# Patient Record
Sex: Female | Born: 1995 | Race: Black or African American | Hispanic: No | Marital: Single | Smoking: Never smoker
Health system: Southern US, Community
[De-identification: ages and names within clinical notes are randomized; demographics above are authoritative.]

## PROBLEM LIST (undated history)

## (undated) DIAGNOSIS — S060XAA Concussion with loss of consciousness status unknown, initial encounter: Secondary | ICD-10-CM

## (undated) DIAGNOSIS — M419 Scoliosis, unspecified: Secondary | ICD-10-CM

## (undated) DIAGNOSIS — G43909 Migraine, unspecified, not intractable, without status migrainosus: Secondary | ICD-10-CM

## (undated) DIAGNOSIS — D649 Anemia, unspecified: Secondary | ICD-10-CM

## (undated) DIAGNOSIS — F419 Anxiety disorder, unspecified: Secondary | ICD-10-CM

## (undated) HISTORY — PX: CYST REMOVAL LEG: SHX6280

---

## 2014-02-16 ENCOUNTER — Emergency Department (HOSPITAL_COMMUNITY)

## 2014-02-16 ENCOUNTER — Emergency Department (HOSPITAL_COMMUNITY)
Admission: EM | Admit: 2014-02-16 | Discharge: 2014-02-16 | Disposition: A | Discharge status: Home / Self Care | Attending: Emergency Medicine | Admitting: Emergency Medicine

## 2014-02-16 ENCOUNTER — Encounter (HOSPITAL_COMMUNITY): Payer: Self-pay | Admitting: Emergency Medicine

## 2014-02-16 DIAGNOSIS — S6000XA Contusion of unspecified finger without damage to nail, initial encounter: Secondary | ICD-10-CM | POA: Insufficient documentation

## 2014-02-16 DIAGNOSIS — W2209XA Striking against other stationary object, initial encounter: Secondary | ICD-10-CM | POA: Insufficient documentation

## 2014-02-16 DIAGNOSIS — Y9389 Activity, other specified: Secondary | ICD-10-CM | POA: Insufficient documentation

## 2014-02-16 DIAGNOSIS — Y929 Unspecified place or not applicable: Secondary | ICD-10-CM | POA: Insufficient documentation

## 2014-02-16 MED ORDER — HYDROCODONE-ACETAMINOPHEN 5-325 MG PO TABS
1.0000 | ORAL_TABLET | Freq: Once | ORAL | Status: AC
Start: 2014-02-16 — End: 2014-02-16
  Administered 2014-02-16: 1 via ORAL
  Filled 2014-02-16: qty 1

## 2014-02-16 NOTE — Discharge Instructions (Signed)
Wear the splint to protect the finger tip.  You may lose your nail over the next several weeks if blood accumulates underneath the nail.  Remove your artificial nail. Keep your fingernail trimmed.

## 2014-02-16 NOTE — ED Notes (Signed)
Patient presents to ED with complaints of lefthand pain from slamming her hand in a bedside "trunk." Pt able to move hand and fingers, no bruising, bleeding, swelling or laceration noted to hand but small blood to her left middle fingernail.

## 2014-02-16 NOTE — ED Notes (Signed)
Discharge instructions reviewed with pt. Pt verbalized understanding.   

## 2014-02-16 NOTE — ED Provider Notes (Addendum)
CSN: [id number]     Arrival date & time 02/16/14  0354 History   First MD Initiated Contact with Patient 02/16/14 0356     Chief Complaint  Patient presents with  . Hand Pain      HPI  Patient had a left hand only on the edge of a large that side trunk. The lid slammed down. She has pain across her fingers and some blood adjacent her left middle fingernail. She is wearing large pink fake nails  History reviewed. No pertinent past medical history. History reviewed. No pertinent past surgical history. No family history on file. History  Substance Use Topics  . Smoking status: Never Smoker   . Smokeless tobacco: Not on file  . Alcohol Use: No   OB History   Grav Para Term Preterm Abortions TAB SAB Ect Mult Living                 Review of Systems Nasally pain to the left hand. No additional areas of pain or concern or injury   Allergies  Review of patient's allergies indicates not on file.  Home Medications   Prior to Admission medications   Medication Sig Start Date End Date Taking? Authorizing Provider  levonorgestrel-ethinyl estradiol (SEASONALE,INTROVALE,JOLESSA) 0.15-0.03 MG tablet Take 1 tablet by mouth daily.   Yes Historical Provider, MD  naproxen (NAPROSYN) 250 MG tablet Take 250 mg by mouth daily as needed.   Yes Historical Provider, MD   BP 116/65  Temp(Src) 98.1 F (36.7 C) (Oral)  Resp 18  SpO2 100%  LMP 01/31/2014 Physical Exam  Musculoskeletal:       Hands:  am unable to determine if she has subungual hematoma her nails are pink and opaque  ED Course  Procedures (including critical care time) Labs Review Labs Reviewed - No data to display  Imaging Review Dg Hand Complete Left  02/16/2014   CLINICAL DATA:  Left hand pain at the distal middle finger after injury.  EXAM: LEFT HAND - COMPLETE 3+ VIEW  COMPARISON:  None.  FINDINGS: There is no evidence of fracture or dislocation. There is no evidence of arthropathy or other focal bone abnormality.  Soft tissues are unremarkable.  IMPRESSION: Negative.   Electronically Signed   By: Burman NievesWilliam  Stevens M.D.   On: 02/16/2014 04:44     EKG Interpretation None      MDM   Final diagnoses:  Finger contusion    X-ray show no fractures. She has some blood adjacent to the nail at the tip. She has opaque pink false nails. She does not want me to remove this. I did tell her I could not tell her the probability of losing her nail as I cannot tell if she has a subungual hematoma. Asked her to remove the nail herself when she is able to do so when her pain is less. To keep her native nail trimmed.    Rolland PorterMark Jacqueleen Pulver, MD 02/16/14 [id number]  Rolland PorterMark Domnick Chervenak, MD 02/16/14 [phone]

## 2014-12-26 ENCOUNTER — Emergency Department (HOSPITAL_COMMUNITY)
Admission: EM | Admit: 2014-12-26 | Discharge: 2014-12-26 | Disposition: A | Discharge status: Home / Self Care | Attending: Emergency Medicine | Admitting: Emergency Medicine

## 2014-12-26 ENCOUNTER — Encounter (HOSPITAL_COMMUNITY): Payer: Self-pay | Admitting: *Deleted

## 2014-12-26 DIAGNOSIS — Z760 Encounter for issue of repeat prescription: Secondary | ICD-10-CM | POA: Diagnosis not present

## 2014-12-26 DIAGNOSIS — Z331 Pregnant state, incidental: Secondary | ICD-10-CM | POA: Diagnosis not present

## 2014-12-26 DIAGNOSIS — K208 Other esophagitis: Secondary | ICD-10-CM | POA: Insufficient documentation

## 2014-12-26 DIAGNOSIS — T50905A Adverse effect of unspecified drugs, medicaments and biological substances, initial encounter: Secondary | ICD-10-CM

## 2014-12-26 DIAGNOSIS — R109 Unspecified abdominal pain: Secondary | ICD-10-CM | POA: Diagnosis present

## 2014-12-26 DIAGNOSIS — M419 Scoliosis, unspecified: Secondary | ICD-10-CM | POA: Insufficient documentation

## 2014-12-26 DIAGNOSIS — N390 Urinary tract infection, site not specified: Secondary | ICD-10-CM | POA: Diagnosis not present

## 2014-12-26 DIAGNOSIS — Z793 Long term (current) use of hormonal contraceptives: Secondary | ICD-10-CM | POA: Diagnosis not present

## 2014-12-26 DIAGNOSIS — Z8679 Personal history of other diseases of the circulatory system: Secondary | ICD-10-CM | POA: Insufficient documentation

## 2014-12-26 HISTORY — DX: Scoliosis, unspecified: M41.9

## 2014-12-26 HISTORY — DX: Migraine, unspecified, not intractable, without status migrainosus: G43.909

## 2014-12-26 LAB — BASIC METABOLIC PANEL
Anion gap: 3 — ABNORMAL LOW (ref 5–15)
BUN: 10 mg/dL (ref 6–23)
CALCIUM: 9.1 mg/dL (ref 8.4–10.5)
CO2: 27 mmol/L (ref 19–32)
Chloride: 105 mmol/L (ref 96–112)
Creatinine, Ser: 0.63 mg/dL (ref 0.50–1.10)
GFR calc Af Amer: >90 mL/min (ref >90)
GFR calc non Af Amer: >90 mL/min (ref >90)
Glucose, Bld: 108 mg/dL — ABNORMAL HIGH (ref 70–99)
Potassium: 3.6 mmol/L (ref 3.5–5.1)
SODIUM: 135 mmol/L (ref 135–145)

## 2014-12-26 LAB — CBC WITH DIFFERENTIAL/PLATELET
BASOS PCT: 0 % (ref 0–1)
Basophils Absolute: 0 10*3/uL (ref 0.0–0.1)
EOS ABS: 0 10*3/uL (ref 0.0–0.7)
EOS PCT: 0 % (ref 0–5)
HEMATOCRIT: 31.7 % — AB (ref 36.0–46.0)
Hemoglobin: 10.8 g/dL — ABNORMAL LOW (ref 12.0–15.0)
Lymphocytes Relative: 20 % (ref 12–46)
Lymphs Abs: 2.5 10*3/uL (ref 0.7–4.0)
MCH: 29.8 pg (ref 26.0–34.0)
MCHC: 34.1 g/dL (ref 30.0–36.0)
MCV: 87.6 fL (ref 78.0–100.0)
Monocytes Absolute: 0.7 10*3/uL (ref 0.1–1.0)
Monocytes Relative: 5 % (ref 3–12)
NEUTROS PCT: 75 % (ref 43–77)
Neutro Abs: 9.4 10*3/uL — ABNORMAL HIGH (ref 1.7–7.7)
PLATELETS: 299 10*3/uL (ref 150–400)
RBC: 3.62 MIL/uL — AB (ref 3.87–5.11)
RDW: 13.1 % (ref 11.5–15.5)
WBC: 12.6 10*3/uL — ABNORMAL HIGH (ref 4.0–10.5)

## 2014-12-26 LAB — URINALYSIS, ROUTINE W REFLEX MICROSCOPIC
Bilirubin Urine: NEGATIVE
Glucose, UA: NEGATIVE mg/dL
Hgb urine dipstick: NEGATIVE
Ketones, ur: NEGATIVE mg/dL
Nitrite: POSITIVE — AB
Protein, ur: NEGATIVE mg/dL
Specific Gravity, Urine: 1.015 (ref 1.005–1.030)
UROBILINOGEN UA: 0.2 mg/dL (ref 0.0–1.0)
pH: 6.5 (ref 5.0–8.0)

## 2014-12-26 LAB — HEPATIC FUNCTION PANEL
ALK PHOS: 48 U/L (ref 39–117)
ALT: 16 U/L (ref 0–35)
AST: 21 U/L (ref 0–37)
Albumin: 4 g/dL (ref 3.5–5.2)
BILIRUBIN TOTAL: 0.3 mg/dL (ref 0.3–1.2)
Bilirubin, Direct: <0.1 mg/dL (ref 0.0–0.5)
Total Protein: 7 g/dL (ref 6.0–8.3)

## 2014-12-26 LAB — LIPASE, BLOOD: Lipase: 28 U/L (ref 11–59)

## 2014-12-26 LAB — URINE MICROSCOPIC-ADD ON

## 2014-12-26 LAB — POC URINE PREG, ED: Preg Test, Ur: POSITIVE — AB

## 2014-12-26 MED ORDER — RIZATRIPTAN BENZOATE 10 MG PO TABS: 10.0000 mg | ORAL_TABLET | ORAL | Status: DC | PRN

## 2014-12-26 MED ORDER — CEPHALEXIN 500 MG PO CAPS: 500.0000 mg | ORAL_CAPSULE | Freq: Four times a day (QID) | ORAL | Status: DC

## 2014-12-26 MED ORDER — GI COCKTAIL ~~LOC~~
30.0000 mL | Freq: Once | ORAL | Status: AC
  Administered 2014-12-26: 30 mL via ORAL
  Filled 2014-12-26: qty 30

## 2014-12-26 MED ORDER — MAGIC MOUTHWASH W/LIDOCAINE: 5.0000 mL | Freq: Four times a day (QID) | ORAL | Status: DC

## 2014-12-26 NOTE — ED Notes (Signed)
Patient presents stating she has had epigastric pain since about noon

## 2014-12-26 NOTE — Discharge Instructions (Signed)
Esophagitis °Esophagitis is inflammation of the esophagus. It can involve swelling, soreness, and pain in the esophagus. This condition can make it difficult and painful to swallow. °CAUSES  °Most causes of esophagitis are not serious. Many different factors can cause esophagitis, including: °· Gastroesophageal reflux disease (GERD). This is when acid from your stomach flows up into the esophagus. °· Recurrent vomiting. °· An allergic-type reaction. °· Certain medicines, especially those that come in large pills. °· Ingestion of harmful chemicals, such as household cleaning products. °· Heavy alcohol use. °· An infection of the esophagus. °· Radiation treatment for cancer. °· Certain diseases such as sarcoidosis, Crohn's disease, and scleroderma. These diseases may cause recurrent esophagitis. °SYMPTOMS  °· Trouble swallowing. °· Painful swallowing. °· Chest pain. °· Difficulty breathing. °· Nausea. °· Vomiting. °· Abdominal pain. °DIAGNOSIS  °Your caregiver will take your history and do a physical exam. Depending upon what your caregiver finds, certain tests may also be done, including: °· Barium X-ray. You will drink a solution that coats the esophagus, and X-rays will be taken. °· Endoscopy. A lighted tube is put down the esophagus so your caregiver can examine the area. °· Allergy tests. These can sometimes be arranged through follow-up visits. °TREATMENT  °Treatment will depend on the cause of your esophagitis. In some cases, steroids or other medicines may be given to help relieve your symptoms or to treat the underlying cause of your condition. Medicines that may be recommended include: °· Viscous lidocaine, to soothe the esophagus. °· Antacids. °· Acid reducers. °· Proton pump inhibitors. °· Antiviral medicines for certain viral infections of the esophagus. °· Antifungal medicines for certain fungal infections of the esophagus. °· Antibiotic medicines, depending on the cause of the esophagitis. °HOME CARE  INSTRUCTIONS  °· Avoid foods and drinks that seem to make your symptoms worse. °· Eat small, frequent meals instead of large meals. °· Avoid eating for the 3 hours prior to your bedtime. °· If you have trouble taking pills, use a pill splitter to decrease the size and likelihood of the pill getting stuck or injuring the esophagus on the way down. Drinking water after taking a pill also helps. °· Stop smoking if you smoke. °· Maintain a healthy weight. °· Wear loose-fitting clothing. Do not wear anything tight around your waist that causes pressure on your stomach. °· Raise the head of your bed 6 to 8 inches with wood blocks to help you sleep. Extra pillows will not help. °· Only take over-the-counter or prescription medicines as directed by your caregiver. °SEEK IMMEDIATE MEDICAL CARE IF: °· You have severe chest pain that radiates into your arm, neck, or jaw. °· You feel sweaty, dizzy, or lightheaded. °· You have shortness of breath. °· You vomit blood. °· You have difficulty or pain with swallowing. °· You have bloody or black, tarry stools. °· You have a fever. °· You have a burning sensation in the chest more than 3 times a week for more than 2 weeks. °· You cannot swallow, drink, or eat. °· You drool because you cannot swallow your saliva. °MAKE SURE YOU: °· Understand these instructions. °· Will watch your condition. °· Will get help right away if you are not doing well or get worse. °Document Released: 11/22/2004 Document Revised: 01/07/2012 Document Reviewed: 06/15/2011 °ExitCare® Patient Information ©2015 [employer]. This information is not intended to replace advice given to you by your health care provider. Make sure you discuss any questions you have with your health care provider. ° °  Urinary Tract Infection °Urinary tract infections (UTIs) can develop anywhere along your urinary tract. Your urinary tract is your body's drainage system for removing wastes and extra water. Your urinary tract includes  two kidneys, two ureters, a bladder, and a urethra. Your kidneys are a pair of bean-shaped organs. Each kidney is about the size of your fist. They are located below your ribs, one on each side of your spine. °CAUSES °Infections are caused by microbes, which are microscopic organisms, including fungi, viruses, and bacteria. These organisms are so small that they can only be seen through a microscope. Bacteria are the microbes that most commonly cause UTIs. °SYMPTOMS  °Symptoms of UTIs may vary by age and gender of the patient and by the location of the infection. Symptoms in young women typically include a frequent and intense urge to urinate and a painful, burning feeling in the bladder or urethra during urination. Older women and men are more likely to be tired, shaky, and weak and have muscle aches and abdominal pain. A fever may mean the infection is in your kidneys. Other symptoms of a kidney infection include pain in your back or sides below the ribs, nausea, and vomiting. °DIAGNOSIS °To diagnose a UTI, your caregiver will ask you about your symptoms. Your caregiver also will ask to provide a urine sample. The urine sample will be tested for bacteria and white blood cells. White blood cells are made by your body to help fight infection. °TREATMENT  °Typically, UTIs can be treated with medication. Because most UTIs are caused by a bacterial infection, they usually can be treated with the use of antibiotics. The choice of antibiotic and length of treatment depend on your symptoms and the type of bacteria causing your infection. °HOME CARE INSTRUCTIONS °· If you were prescribed antibiotics, take them exactly as your caregiver instructs you. Finish the medication even if you feel better after you have only taken some of the medication. °· Drink enough water and fluids to keep your urine clear or pale yellow. °· Avoid caffeine, tea, and carbonated beverages. They tend to irritate your bladder. °· Empty your bladder  often. Avoid holding urine for long periods of time. °· Empty your bladder before and after sexual intercourse. °· After a bowel movement, women should cleanse from front to back. Use each tissue only once. °SEEK MEDICAL CARE IF:  °· You have back pain. °· You develop a fever. °· Your symptoms do not begin to resolve within 3 days. °SEEK IMMEDIATE MEDICAL CARE IF:  °· You have severe back pain or lower abdominal pain. °· You develop chills. °· You have nausea or vomiting. °· You have continued burning or discomfort with urination. °MAKE SURE YOU:  °· Understand these instructions. °· Will watch your condition. °· Will get help right away if you are not doing well or get worse. °Document Released: 07/25/2005 Document Revised: 04/15/2012 Document Reviewed: 11/23/2011 °ExitCare® Patient Information ©2015 [employer]. This information is not intended to replace advice given to you by your health care provider. Make sure you discuss any questions you have with your health care provider. ° °

## 2014-12-26 NOTE — ED Provider Notes (Signed)
CSN: [id number]     Arrival date & time 12/26/14  0014 History   First MD Initiated Contact with Patient 12/26/14 [id number]     Chief Complaint  Patient presents with  . Abdominal Pain     (Consider location/radiation/quality/duration/timing/severity/associated sxs/prior Treatment) HPI   This is a 19 year old female who presents emergency Department with chief complaint of pain with swallowing. Patient states that yesterday she had back pain. She took 2 Advil, but felt that she swallowed them incompletely. Later she developed a sensation of burning in her chest. She tried drinking more fluids to get the Advil down. Since that time she's had significant chest pain that occurs with swallowing. Her pain is also made worse after she eats. She's been drinking cold fluids to try to alleviate her symptoms. She denies nausea, vomiting, diarrhea, hematochezia, hemoptysis or melena. Patient states last menstrual period was 12/14/2014. She denies abdominal pain, urinary symptoms, vaginal symptoms. 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 She asks if I could refill her Maxalt prescription. She does get migraine headaches [place]. He denies a headache at this time   Past Medical History  Diagnosis Date  . Migraines   . Scoliosis    Past Surgical History  Procedure Laterality Date  . Cyst removal leg     No family history on file. History  Substance Use Topics  . Smoking status: Never Smoker   . Smokeless tobacco: Never Used  . Alcohol Use: No   OB History    No data available     Review of Systems  Ten systems reviewed and are negative for acute change, except as noted in the HPI.    Allergies  Review of patient's allergies indicates no known allergies.  Home Medications   Prior to Admission medications   Medication Sig Start Date End Date Taking? Authorizing Provider  ibuprofen (ADVIL,MOTRIN) 800 MG tablet  Take 800 mg by mouth every 8 (eight) hours as needed for moderate pain.   Yes Historical Provider, MD  levonorgestrel-ethinyl estradiol (SEASONALE,INTROVALE,JOLESSA) 0.15-0.03 MG tablet Take 1 tablet by mouth daily.   Yes Historical Provider, MD   BP 104/74 mmHg  Pulse 72  Temp(Src) 98 F (36.7 C) (Oral)  Resp 21  Ht 5\' 6"  (1.676 m)  Wt 155 lb (70.308 kg)  BMI 25.03 kg/m2  SpO2 98%  LMP 12/14/2014 Physical Exam  Constitutional: She is oriented to person, place, and time. She appears well-developed and well-nourished. No distress.  HENT:  Head: Normocephalic and atraumatic.  Eyes: Conjunctivae are normal. No scleral icterus.  Neck: Normal range of motion.  Cardiovascular: Normal rate, regular rhythm and normal heart sounds.  Exam reveals no gallop and no friction rub.   No murmur heard. Pulmonary/Chest: Effort normal and breath sounds normal. No respiratory distress.  Abdominal: Soft. Bowel sounds are normal. She exhibits no distension and no mass. There is no tenderness. There is no guarding.  Neurological: She is alert and oriented to person, place, and time.  Skin: Skin is warm and dry. She is not diaphoretic.  Psychiatric: Her behavior is normal.    ED Course  Procedures (including critical care time) Labs Review Labs Reviewed  CBC WITH DIFFERENTIAL/PLATELET - Abnormal; Notable for the following:    WBC 12.6 (*)    RBC 3.62 (*)    Hemoglobin 10.8 (*)    HCT 31.7 (*)    Neutro Abs 9.4 (*)  All other components within normal limits  BASIC METABOLIC PANEL - Abnormal; Notable for the following:    Glucose, Bld 108 (*)    Anion gap 3 (*)    All other components within normal limits  URINALYSIS, ROUTINE W REFLEX MICROSCOPIC - Abnormal; Notable for the following:    APPearance CLOUDY (*)    Nitrite POSITIVE (*)    Leukocytes, UA TRACE (*)    All other components within normal limits  URINE MICROSCOPIC-ADD ON - Abnormal; Notable for the following:    Bacteria, UA MANY  (*)    All other components within normal limits  POC URINE PREG, ED - Abnormal; Notable for the following:    Preg Test, Ur POSITIVE (*)    All other components within normal limits  HEPATIC FUNCTION PANEL  LIPASE, BLOOD    Imaging Review No results found.   EKG Interpretation   Date/Time:  Sunday December 26 2014 00:16:44 EST Ventricular Rate:  83 PR Interval:  150 QRS Duration: 64 QT Interval:  374 QTC Calculation: 439 R Axis:   78 Text Interpretation:  Normal sinus rhythm with sinus arrhythmia Normal ECG  No old tracing to compare Confirmed by KNAPP  MD-I, IVA ([id number]) on  12/26/2014 7:35:23 AM      MDM   Final diagnoses:  None    Filed Vitals:   12/26/14 0715 12/26/14 0730 12/26/14 0845 12/26/14 0900  BP: 104/74  103/67 99/65  Pulse: 72  79 76  Temp:  98 F (36.7 C)    TempSrc:  Oral    Resp: Height:      Weight:      SpO2: 98%  98% 99%   Assumed care of the patient after 6 hour wait. Patient had a BMP and CBC performed. I had added on. A Hepatic function panel, urine, urine pregnancy.    Patient hepatic panel appears within normal limits. She does have a positive urine pregnancy today. I discussed this with the patient. Patient states that she underwent an elective abortion on February 13. Patient states that her "period" was bleeding that occurred after. She denies any vaginal symptoms at this time. Feels that her pregnancy hormone elevation is probably still elevated after the abortion.  She had some relief with GI cocktail. Her labs. Otherwise normal. She does have a urinary tract infection today. Patient will be discharged with Keflex, Magic mouthwash with lidocaine, and her Maxalt prescription. She is tolerating fluids by mouth. She appears safe for discharge at this time. I have discussed return precautions. I personally reviewed the imaging tests through PACS system. I have reviewed and interpreted Lab values. I reviewed available  ER/hospitalization records through the EMR        Arthor Captain, PA-C 12/26/14 1020  Ward Givens, MD 12/27/14 [phone]

## 2015-02-19 ENCOUNTER — Encounter (HOSPITAL_COMMUNITY): Payer: Self-pay

## 2015-02-19 ENCOUNTER — Emergency Department (HOSPITAL_COMMUNITY)
Admission: EM | Admit: 2015-02-19 | Discharge: 2015-02-19 | Disposition: A | Discharge status: Home / Self Care | Attending: Emergency Medicine | Admitting: Emergency Medicine

## 2015-02-19 DIAGNOSIS — Z79899 Other long term (current) drug therapy: Secondary | ICD-10-CM | POA: Diagnosis not present

## 2015-02-19 DIAGNOSIS — J029 Acute pharyngitis, unspecified: Secondary | ICD-10-CM | POA: Insufficient documentation

## 2015-02-19 DIAGNOSIS — Z8739 Personal history of other diseases of the musculoskeletal system and connective tissue: Secondary | ICD-10-CM | POA: Diagnosis not present

## 2015-02-19 DIAGNOSIS — G43909 Migraine, unspecified, not intractable, without status migrainosus: Secondary | ICD-10-CM | POA: Insufficient documentation

## 2015-02-19 LAB — RAPID STREP SCREEN (MED CTR MEBANE ONLY): Streptococcus, Group A Screen (Direct): NEGATIVE

## 2015-02-19 MED ORDER — DEXAMETHASONE SODIUM PHOSPHATE 10 MG/ML IJ SOLN
10.0000 mg | Freq: Once | INTRAMUSCULAR | Status: AC
  Administered 2015-02-19: 10 mg via INTRAMUSCULAR
  Filled 2015-02-19: qty 1

## 2015-02-19 MED ORDER — IBUPROFEN 400 MG PO TABS
800.0000 mg | ORAL_TABLET | Freq: Once | ORAL | Status: AC
  Administered 2015-02-19: 800 mg via ORAL
  Filled 2015-02-19: qty 4

## 2015-02-19 MED ORDER — IBUPROFEN 600 MG PO TABS: 600.0000 mg | ORAL_TABLET | Freq: Four times a day (QID) | ORAL | Status: DC | PRN

## 2015-02-19 NOTE — ED Provider Notes (Signed)
CSN: [id number]     Arrival date & time 02/19/15  1122 History  This chart was scribed for non-physician practitioner, Jinny SandersJoseph Herbie Lehrmann, PA-C working with Gilda Creasehristopher J Pollina, MD, by Abel PrestoKara Demonbreun, ED Scribe. This patient was seen in room TR07C/TR07C and the patient's care was started at 11:38 AM.     Chief Complaint  Patient presents with  . Sore Throat     Patient is a 19 y.o. female presenting with pharyngitis. The history is provided by the patient. No language interpreter was used.  Sore Throat   HPI Comments: Sharon Powell is a 19 y.o. female who presents to the Emergency Department complaining of sore throat with onset 2 days ago. Pt notes associated odynophagia with onset today. Pt has not taken any medication for relief. Pt denies cough and fever, SOB.    Past Medical History  Diagnosis Date  . Migraines   . Scoliosis    Past Surgical History  Procedure Laterality Date  . Cyst removal leg     No family history on file. History  Substance Use Topics  . Smoking status: Never Smoker   . Smokeless tobacco: Never Used  . Alcohol Use: No   OB History    No data available     Review of Systems  Constitutional: Negative for fever.  HENT: Positive for sore throat.   Respiratory: Negative for cough.       Allergies  Review of patient's allergies indicates no known allergies.  Home Medications   Prior to Admission medications   Medication Sig Start Date End Date Taking? Authorizing Provider  Alum & Mag Hydroxide-Simeth (MAGIC MOUTHWASH W/LIDOCAINE) SOLN Take 5-10 mLs by mouth 4 (four) times daily. 12/26/14   Arthor CaptainAbigail Harris, PA-C  cephALEXin (KEFLEX) 500 MG capsule Take 1 capsule (500 mg total) by mouth 4 (four) times daily. 12/26/14   Arthor CaptainAbigail Harris, PA-C  ibuprofen (ADVIL,MOTRIN) 600 MG tablet Take 1 tablet (600 mg total) by mouth every 6 (six) hours as needed. 02/19/15   Ladona MowJoe Nahla Lukin, PA-C  levonorgestrel-ethinyl estradiol (SEASONALE,INTROVALE,JOLESSA) 0.15-0.03 MG  tablet Take 1 tablet by mouth daily.    Historical Provider, MD  rizatriptan (MAXALT) 10 MG tablet Take 1 tablet (10 mg total) by mouth as needed for migraine. May repeat in 2 hours if needed 12/26/14   Arthor CaptainAbigail Harris, PA-C   BP 107/66 mmHg  Pulse 85  Temp(Src) 97.8 F (36.6 C) (Oral)  Resp 20  Ht 5\' 6"  (1.676 m)  Wt 149 lb (67.586 kg)  BMI 24.06 kg/m2  SpO2 98%  LMP 02/13/2015 Physical Exam  Constitutional: She is oriented to person, place, and time. She appears well-developed and well-nourished.  HENT:  Head: Normocephalic.  Right Ear: Tympanic membrane normal.  Left Ear: Tympanic membrane normal.  Mouth/Throat: Posterior oropharyngeal erythema (mild) present. No oropharyngeal exudate.  Nasal turbinates non erythematous, non edematous No tonsillar swelling  Eyes: Conjunctivae are normal.  Neck: Normal range of motion. Neck supple.  Pulmonary/Chest: Effort normal.  Musculoskeletal: Normal range of motion.  Neurological: She is alert and oriented to person, place, and time.  Skin: Skin is warm and dry.  Psychiatric: She has a normal mood and affect. Her behavior is normal.  Nursing note and vitals reviewed.   ED Course  Procedures (including critical care time) DIAGNOSTIC STUDIES: Oxygen Saturation is 98% on room air, normal by my interpretation.    COORDINATION OF CARE: 11:42 AM Discussed treatment plan with patient at beside, the patient agrees with the plan and has  no further questions at this time.   Labs Review Labs Reviewed  RAPID STREP SCREEN  CULTURE, GROUP A STREP    Imaging Review No results found.   EKG Interpretation None     MDM   Final diagnoses:  Viral pharyngitis   Patient here with signs and symptoms consistent with a viral pharyngitis. Rapid strep negative. Patient afebrile, well-appearing, nontoxic, hemodynamically stable and in no acute distress. Patient tolerating by mouth well, no trismus or tonsillar swelling, no concern for PTA.  Encouraged use of ibuprofen and Tylenol for pain. Discharged with instruction to follow up with primary care provider. Return precautions discussed, patient verbalizes understanding and agreement of this plan. Encouraged patient to call or return to the ER with any worsening symptoms or should she have any questions or concerns.  I personally performed the services described in this documentation, which was scribed in my presence. The recorded information has been reviewed and is accurate.  BP 107/66 mmHg  Pulse 85  Temp(Src) 97.8 F (36.6 C) (Oral)  Resp 20  Ht  (1.676 m)  Wt 149 lb (67.586 kg)  BMI 24.06 kg/m2  SpO2 98%  LMP 02/13/2015  Signed,  Ladona Mow, PA-C 12:24 PM    Ladona Mow, PA-C 02/19/15 1224  [name], MD 02/19/15 1230

## 2015-02-19 NOTE — ED Notes (Signed)
Pt here for sore throat the past few days. Hurts worse this morning.

## 2015-02-19 NOTE — Discharge Instructions (Signed)
Pharyngitis °Pharyngitis is redness, pain, and swelling (inflammation) of your pharynx.  °CAUSES  °Pharyngitis is usually caused by infection. Most of the time, these infections are from viruses (viral) and are part of a cold. However, sometimes pharyngitis is caused by bacteria (bacterial). Pharyngitis can also be caused by allergies. Viral pharyngitis may be spread from person to person by coughing, sneezing, and personal items or utensils (cups, forks, spoons, toothbrushes). Bacterial pharyngitis may be spread from person to person by more intimate contact, such as kissing.  °SIGNS AND SYMPTOMS  °Symptoms of pharyngitis include:   °· Sore throat.   °· Tiredness (fatigue).   °· Low-grade fever.   °· Headache. °· Joint pain and muscle aches. °· Skin rashes. °· Swollen lymph nodes. °· Plaque-like film on throat or tonsils (often seen with bacterial pharyngitis). °DIAGNOSIS  °Your health care provider will ask you questions about your illness and your symptoms. Your medical history, along with a physical exam, is often all that is needed to diagnose pharyngitis. Sometimes, a rapid strep test is done. Other lab tests may also be done, depending on the suspected cause.  °TREATMENT  °Viral pharyngitis will usually get better in 3-4 days without the use of medicine. Bacterial pharyngitis is treated with medicines that kill germs (antibiotics).  °HOME CARE INSTRUCTIONS  °· Drink enough water and fluids to keep your urine clear or pale yellow.   °· Only take over-the-counter or prescription medicines as directed by your health care provider:   °¨ If you are prescribed antibiotics, make sure you finish them even if you start to feel better.   °¨ Do not take aspirin.   °· Get lots of rest.   °· Gargle with 8 oz of salt water (½ tsp of salt per 1 qt of water) as often as every 1-2 hours to soothe your throat.   °· Throat lozenges (if you are not at risk for choking) or sprays may be used to soothe your throat. °SEEK MEDICAL  CARE IF:  °· You have large, tender lumps in your neck. °· You have a rash. °· You cough up green, yellow-brown, or bloody spit. °SEEK IMMEDIATE MEDICAL CARE IF:  °· Your neck becomes stiff. °· You drool or are unable to swallow liquids. °· You vomit or are unable to keep medicines or liquids down. °· You have severe pain that does not go away with the use of recommended medicines. °· You have trouble breathing (not caused by a stuffy nose). °MAKE SURE YOU:  °· Understand these instructions. °· Will watch your condition. °· Will get help right away if you are not doing well or get worse. °Document Released: 10/15/2005 Document Revised: 08/05/2013 Document Reviewed: 06/22/2013 °ExitCare® Patient Information ©2015 [employer]. This information is not intended to replace advice given to you by your health care provider. Make sure you discuss any questions you have with your health care provider. ° ° °Emergency Department Resource Guide °1) Find a Doctor and Pay Out of Pocket °Although you won't have to find out who is covered by your insurance plan, it is a good idea to ask around and get recommendations. You will then need to call the office and see if the doctor you have chosen will accept you as a new patient and what types of options they offer for patients who are self-pay. Some doctors offer discounts or will set up payment plans for their patients who do not have insurance, but you will need to ask so you aren't surprised when you get to your appointment. ° °  2) Contact Your Local Health Department °Not all health departments have doctors that can see patients for sick visits, but many do, so it is worth a call to see if yours does. If you don't know where your local health department is, you can check in your phone book. The CDC also has a tool to help you locate your state's health department, and many state websites also have listings of all of their local health departments. ° °3) Find a Walk-in Clinic °If  your illness is not likely to be very severe or complicated, you may want to try a walk in clinic. These are popping up all over the country in pharmacies, drugstores, and shopping centers. They're usually staffed by nurse practitioners or physician assistants that have been trained to treat common illnesses and complaints. They're usually fairly quick and inexpensive. However, if you have serious medical issues or chronic medical problems, these are probably not your best option. ° °No Primary Care Doctor: °- Call [facility] at  832-8000 - they can help you locate a primary care doctor that  accepts your insurance, provides certain services, etc. °- Physician Referral Service- 1-800-533-3463 ° °Chronic Pain Problems: °Organization         Address  Phone   Notes  °[facility]  (336) 297-2271 Patients need to be referred by their primary care doctor.  ° °Medication Assistance: °Organization         Address  Phone   Notes  °[place] [facility] [address] °[place], [place] [address] (336) 641-8030 --Must be a resident of [place] °-- Must have NO insurance coverage whatsoever (no Medicaid/ Medicare, etc.) °-- The pt. MUST have a primary care doctor that directs their care regularly and follows them in the community °  °[facility]  (866) 331-1348   °[employer]  (888) 892-1162   ° °Agencies that provide inexpensive medical care: °Organization         Address  Phone   Notes  °[facility]  (336) 832-8035   °[facility]    (336) 832-7272   °[facility] [address] °[place], [place] [address] (336) 832-4777   °[facility] [address], °[place] (336) 271-4999   °[facility]    (336) 373-0678   °[facility]    (336) 272-1050   °[facility] ° [address], [place] Phone:  (336) 832-4444, Fax:  (336) 832-4440 Hours of  Operation:  9 am - 6 pm, M-F.  Also accepts Medicaid/Medicare and self-pay.  °[facility] ° [address], [place] Phone: (336) 832-3150, Fax: (336) [phone]. Hours of Operation:  8:30 am - 5:30 pm, M-F.  Also accepts Medicaid and self-pay.  °[facility] [place] [address], [place] Phone: (336) 878-6027   °[facility] [address], [place], [place] (336)723-1848, Ext. 123 Mondays & Thursdays: 7-9 AM.  First 15 patients are seen on a first come, first serve basis. °  ° °Medicaid-accepting [place] Providers: ° °Organization         Address  Phone   Notes  °[facility] [address], [place] (336) 641-2100 Also accepts self-pay patients.  °[facility] [address], [place] ° (336) 856-9996   °[facility] [address], [place] (336) 288-8857   °[facility] [address] [place] [address], [place] (336) 299-7000   °Veita Bland [address], [place]  ° (336) 373-1557 Only accepts [place] Access Medicaid patients after they have their name applied to their card.  ° °Self-Pay (no insurance) in [place]: ° °Organization         Address  Phone   Notes  °Sickle Cell Patients, [facility] [address], [place] (336) 832-1970   °[facility] [address], [place] (336) 832-4400   °[facility] [place] ° [address], [place] (336) 992-4800   °[facility]/Dr. Osei-Bonsu ° [address] [place] [address], [place] or [address], [place] (336) [phone] Phone number for both [place] and [place] locations is the same.  °[facility] [address], [place] (336) 299-0000   °[facility] [address] [place] [address], [place] or [address] (336) 852-7530 °(336) 878-2260   °[facility] [address], [place] (336) 350-1642, phone; (336) 294-5005, fax Sees patients 1st and 3rd Saturday of every month.  Must not qualify for public or private insurance (i.e. Medicaid, Medicare, [place] Health Choice, Veterans' Benefits) • Household income should be no more than 200% of the poverty level •The clinic cannot treat you if you are pregnant or think you are pregnant • Sexually transmitted diseases are not treated at the clinic.  ° ° °Dental Care: °Organization         Address  Phone  Notes  °[facility] [address], [place] (336) 641-6152 Accepts children up to age 21 who are enrolled in Medicaid or [place] Health Choice; pregnant women with a Medicaid card; and children who have applied for Medicaid or [place] Health Choice, but were declined, whose parents can pay a reduced fee at time of service.  °[facility] [place]  [address], [place] (336) 641-7733 Accepts children up to age 21 who are enrolled in Medicaid or [place] Health Choice; pregnant women with a Medicaid card; and children who have applied for Medicaid or [place] Health Choice, but were declined, whose parents can pay a reduced fee at time of service.  °[facility] ° [address], [place] (336) 641-4533 Patients are seen by appointment only. Walk-ins are not accepted. [facility] will see patients 18 years of age and older. °Monday - Tuesday (8am-5pm) °Most Wednesdays (8:30-5pm) °$30 per visit, cash only  °[facility] ° [address], [place] (336) 641-4533 Patients are seen by appointment only. Walk-ins are not accepted. [facility] will see patients 18 years of age and older. °One Wednesday Evening (Monthly: Volunteer Based).  $30 per visit, cash only  °[facility]  (919) [phone] for adults; Children under age 4, call Graduate Pediatric  Dentistry at ([phone]) [phone]. Children aged 4-14, please call ([phone]) [phone] to request a pediatric application. ° Dental services are provided in all areas of dental care including fillings, crowns and bridges, complete and partial dentures, implants, gum treatment, root canals, and extractions. Preventive care is also provided. Treatment is provided to both adults and children. °Patients are selected via a lottery and there is often a waiting list. °  °[facility] [address], °  [place] ° (336) 763-8833 [email or web address] °  °[facility] [address], [place], [place] (336)723-1848, Ext. 123 Second and Fourth Thursday of each month, opens at 6:30 AM; Clinic ends at 9 AM.  Patients are seen on a first-come first-served basis, and a limited number are seen during each clinic.  ° °[facility] ° [address], [place], [place] (336) 723-7904   Eligibility Requirements °You must have lived in [place], [place], or [place] counties for at least the last three months. °  You cannot be eligible for state or federal sponsored healthcare insurance, including Veterans Administration, Medicaid, or Medicare. °  You generally cannot be eligible for healthcare insurance through your employer.  °  How to apply: °Eligibility screenings are held every Tuesday and Wednesday afternoon from 1:00 pm until 4:00 pm. You do not need an appointment for the interview!  °[facility] [address], [place], [place] 336-631-2330   °[facility]  336-342-8273   °[facility]  336-703-3100   °[facility]  336-570-6415   ° °Behavioral Health Resources in the Community: °Intensive Outpatient Programs °Organization         Address  Phone  Notes  °[facility] [address], [place], [place] 336-878-6098   °[facility] [address], [place], [place] 336-832-9800   °[facility] [address], [place], [place] ° 336-882-2125   °[facility] [address],  °[place], [place] 1-800-853-5163 or 336-641-4981   °Substance Abuse Resources °Organization         Address  Phone  Notes  °[facility]  336-882-2125   °[facility]  336-784-9470   °[facility]  336-285-9073   °[facility]  336-845-3988   °Residential & Outpatient Substance Abuse Program  1-800-659-3381   °Psychological Services °Organization         Address  Phone  Notes  °[facility]  336- 832-9600   °[facility]  336- 378-7881   °[facility] [address], [place] 1-800-853-5163 or 336-641-4981   ° °Mobile Crisis Teams °Organization         Address  Phone  Notes  °[facility], Mobile Crisis Care Unit  1-877-626-1772   °Assertive °[facility] ° [address] [place], [place] 336-834-9664   °Sharon DeEsch [address] °[place] 336-554-5454   ° °Self-Help/Support Groups °Organization         Address  Phone             Notes  °[facility] - variety of support groups  336- 373-1402 Call for more information  °Narcotics Anonymous (NA), [facility] [address], °[place]  2 meetings at this location  ° °Residential Treatment Programs °Organization         Address  Phone  Notes  °[facility] [address],    °[place]  1-866-801-8205   °[facility] ° [address], [place], [place] 704-293-8524   °[facility] [address], [place] 336-845-3988 Admissions: 8am-3pm M-F  °[facility] [address]    °[place], [place] 336-841-1104   °[facility] [address], [place], [place] 336-379-7146   °[facility] [address]  °[place],  336-285-9073   °[facility] - Intensive Outpatient [address],  [place], [place] 336-852-3033   °[facility] [address]  °[place], [place] 1-877-615-2722 or 336-784-9470   °[facility] [address] [place], [place] 336-227-7417 Accepts Medicaid  °[facility] [address]  °[place] 1-800-659-3381 Substance Abuse/Addiction Treatment  ° °[place] Behavioral Health Resources °Organization         Address  Phone  Notes  °[facility]  (888) 581-9988   °Julie Brannon, PhD [address] [place], [place]   (336) 349-5553 or (336) 951-0000   °[facility]   [address] °[place], [place] (336) 349-4454   °[facility] [address], [place], [place] (336) 342-8316 Insurance/Medicaid/sponsorship through [facility]  °[facility] [address]                                    [place], [place] (336) 342-8316 Therapy/tele-psych/case  °[facility] [address]  ° [place], [place] (336) 349-2233    °Dr. Arfeen  (336) 349-4544   °[facility] [place]  [employer] [place] [facility] 1) [address],  °2) [address], [place] °3)  [address], [place] (336) 349-3220 °(336) 342-7768 ° °(336) 342-8140   °[place] [facility] (336) 342-1394 or (336) 342-3537 (After Hours)    ° ° ° ° °

## 2015-02-21 LAB — CULTURE, GROUP A STREP: Strep A Culture: NEGATIVE

## 2015-02-23 ENCOUNTER — Encounter (HOSPITAL_COMMUNITY): Payer: Self-pay | Admitting: *Deleted

## 2015-02-23 ENCOUNTER — Emergency Department (HOSPITAL_COMMUNITY)

## 2015-02-23 ENCOUNTER — Emergency Department (HOSPITAL_COMMUNITY)
Admission: EM | Admit: 2015-02-23 | Discharge: 2015-02-23 | Disposition: A | Discharge status: Home / Self Care | Attending: Emergency Medicine | Admitting: Emergency Medicine

## 2015-02-23 DIAGNOSIS — Z8739 Personal history of other diseases of the musculoskeletal system and connective tissue: Secondary | ICD-10-CM | POA: Diagnosis not present

## 2015-02-23 DIAGNOSIS — G43909 Migraine, unspecified, not intractable, without status migrainosus: Secondary | ICD-10-CM | POA: Diagnosis not present

## 2015-02-23 DIAGNOSIS — Z792 Long term (current) use of antibiotics: Secondary | ICD-10-CM | POA: Diagnosis not present

## 2015-02-23 DIAGNOSIS — J029 Acute pharyngitis, unspecified: Secondary | ICD-10-CM

## 2015-02-23 DIAGNOSIS — B9789 Other viral agents as the cause of diseases classified elsewhere: Secondary | ICD-10-CM

## 2015-02-23 DIAGNOSIS — Z79899 Other long term (current) drug therapy: Secondary | ICD-10-CM | POA: Insufficient documentation

## 2015-02-23 DIAGNOSIS — J069 Acute upper respiratory infection, unspecified: Secondary | ICD-10-CM | POA: Insufficient documentation

## 2015-02-23 MED ORDER — CETIRIZINE-PSEUDOEPHEDRINE ER 5-120 MG PO TB12: 1.0000 | ORAL_TABLET | Freq: Every day | ORAL | Status: DC

## 2015-02-23 MED ORDER — SALINE SPRAY 0.65 % NA SOLN: 1.0000 | NASAL | Status: DC | PRN

## 2015-02-23 MED ORDER — IBUPROFEN 600 MG PO TABS: 600.0000 mg | ORAL_TABLET | Freq: Four times a day (QID) | ORAL | Status: DC | PRN

## 2015-02-23 MED ORDER — DEXAMETHASONE SODIUM PHOSPHATE 10 MG/ML IJ SOLN
10.0000 mg | Freq: Once | INTRAMUSCULAR | Status: AC
  Administered 2015-02-23: 10 mg via INTRAMUSCULAR
  Filled 2015-02-23: qty 1

## 2015-02-23 MED ORDER — IBUPROFEN 200 MG PO TABS
600.0000 mg | ORAL_TABLET | Freq: Once | ORAL | Status: AC
  Administered 2015-02-23: 600 mg via ORAL
  Filled 2015-02-23 (×2): qty 1

## 2015-02-23 MED ORDER — CETIRIZINE-PSEUDOEPHEDRINE ER 5-120 MG PO TB12: 1.0000 | ORAL_TABLET | Freq: Two times a day (BID) | ORAL | Status: DC

## 2015-02-23 NOTE — Discharge Instructions (Signed)
Upper Respiratory Infection, Adult °An upper respiratory infection (URI) is also sometimes known as the common cold. The upper respiratory tract includes the nose, sinuses, throat, trachea, and bronchi. Bronchi are the airways leading to the lungs. Most people improve within 1 week, but symptoms can last up to 2 weeks. A residual cough may last even longer.  °CAUSES °Many different viruses can infect the tissues lining the upper respiratory tract. The tissues become irritated and inflamed and often become very moist. Mucus production is also common. A cold is contagious. You can easily spread the virus to others by oral contact. This includes kissing, sharing a glass, coughing, or sneezing. Touching your mouth or nose and then touching a surface, which is then touched by another person, can also spread the virus. °SYMPTOMS  °Symptoms typically develop 1 to 3 days after you come in contact with a cold virus. Symptoms vary from person to person. They may include: °· Runny nose. °· Sneezing. °· Nasal congestion. °· Sinus irritation. °· Sore throat. °· Loss of voice (laryngitis). °· Cough. °· Fatigue. °· Muscle aches. °· Loss of appetite. °· Headache. °· Low-grade fever. °DIAGNOSIS  °You might diagnose your own cold based on familiar symptoms, since most people get a cold 2 to 3 times a year. Your caregiver can confirm this based on your exam. Most importantly, your caregiver can check that your symptoms are not due to another disease such as strep throat, sinusitis, pneumonia, asthma, or epiglottitis. Blood tests, throat tests, and X-rays are not necessary to diagnose a common cold, but they may sometimes be helpful in excluding other more serious diseases. Your caregiver will decide if any further tests are required. °RISKS AND COMPLICATIONS  °You may be at risk for a more severe case of the common cold if you smoke cigarettes, have chronic heart disease (such as heart failure) or lung disease (such as asthma), or if  you have a weakened immune system. The very young and very old are also at risk for more serious infections. Bacterial sinusitis, middle ear infections, and bacterial pneumonia can complicate the common cold. The common cold can worsen asthma and chronic obstructive pulmonary disease (COPD). Sometimes, these complications can require emergency medical care and may be life-threatening. °PREVENTION  °The best way to protect against getting a cold is to practice good hygiene. Avoid oral or hand contact with people with cold symptoms. Wash your hands often if contact occurs. There is no clear evidence that vitamin C, vitamin E, echinacea, or exercise reduces the chance of developing a cold. However, it is always recommended to get plenty of rest and practice good nutrition. °TREATMENT  °Treatment is directed at relieving symptoms. There is no cure. Antibiotics are not effective, because the infection is caused by a virus, not by bacteria. Treatment may include: °· Increased fluid intake. Sports drinks offer valuable electrolytes, sugars, and fluids. °· Breathing heated mist or steam (vaporizer or shower). °· Eating chicken soup or other clear broths, and maintaining good nutrition. °· Getting plenty of rest. °· Using gargles or lozenges for comfort. °· Controlling fevers with ibuprofen or acetaminophen as directed by your caregiver. °· Increasing usage of your inhaler if you have asthma. °Zinc gel and zinc lozenges, taken in the first 24 hours of the common cold, can shorten the duration and lessen the severity of symptoms. Pain medicines may help with fever, muscle aches, and throat pain. A variety of non-prescription medicines are available to treat congestion and runny nose. Your caregiver   can make recommendations and may suggest nasal or lung inhalers for other symptoms.  °HOME CARE INSTRUCTIONS  °· Only take over-the-counter or prescription medicines for pain, discomfort, or fever as directed by your  caregiver. °· Use a warm mist humidifier or inhale steam from a shower to increase air moisture. This may keep secretions moist and make it easier to breathe. °· Drink enough water and fluids to keep your urine clear or pale yellow. °· Rest as needed. °· Return to work when your temperature has returned to normal or as your caregiver advises. You may need to stay home longer to avoid infecting others. You can also use a face mask and careful hand washing to prevent spread of the virus. °SEEK MEDICAL CARE IF:  °· After the first few days, you feel you are getting worse rather than better. °· You need your caregiver's advice about medicines to control symptoms. °· You develop chills, worsening shortness of breath, or brown or red sputum. These may be signs of pneumonia. °· You develop yellow or brown nasal discharge or pain in the face, especially when you bend forward. These may be signs of sinusitis. °· You develop a fever, swollen neck glands, pain with swallowing, or white areas in the back of your throat. These may be signs of strep throat. °SEEK IMMEDIATE MEDICAL CARE IF:  °· You have a fever. °· You develop severe or persistent headache, ear pain, sinus pain, or chest pain. °· You develop wheezing, a prolonged cough, cough up blood, or have a change in your usual mucus (if you have chronic lung disease). °· You develop sore muscles or a stiff neck. °Document Released: 04/10/2001 Document Revised: 01/07/2012 Document Reviewed: 01/20/2014 °ExitCare® Patient Information ©2015 [employer]. This information is not intended to replace advice given to you by your health care provider. Make sure you discuss any questions you have with your health care provider. °Salt Water Gargle °This solution will help make your mouth and throat feel better. °HOME CARE INSTRUCTIONS  °· Mix 1 teaspoon of salt in 8 ounces of warm water. °· Gargle with this solution as much or often as you need or as directed. Swish and gargle gently  if you have any sores or wounds in your mouth. °· Do not swallow this mixture. °Document Released: 07/19/2004 Document Revised: 01/07/2012 Document Reviewed: 12/10/2008 °ExitCare® Patient Information ©2015 [employer]. This information is not intended to replace advice given to you by your health care provider. Make sure you discuss any questions you have with your health care provider. ° °

## 2015-02-23 NOTE — ED Notes (Signed)
Pt in c/o cough and congestion, also sore throat, seen for same a few day ago and tested negative for strep, no distress noted

## 2015-02-23 NOTE — ED Provider Notes (Signed)
CSN: [id number]     Arrival date & time 02/23/15  4098 History   First MD Initiated Contact with Patient 02/23/15 0422     Chief Complaint  Patient presents with  . URI    (Consider location/radiation/quality/duration/timing/severity/associated sxs/prior Treatment) Patient is a 19 y.o. female presenting with URI. The history is provided by the patient. No language interpreter was used.  URI Presenting symptoms: congestion, cough, rhinorrhea and sore throat   Presenting symptoms: no fever   Congestion:    Location:  Nasal   Interferes with sleep: no     Interferes with eating/drinking: no   Cough:    Cough characteristics:  Non-productive   Severity:  Mild   Onset quality:  Gradual   Duration:  2 days   Timing:  Intermittent   Progression:  Waxing and waning   Chronicity:  New Sore throat:    Severity:  Moderate   Onset quality:  Gradual   Duration:  4 days   Timing:  Constant   Progression:  Unchanged Severity:  Mild Onset quality:  Gradual Duration:  4 days Timing:  Constant Progression:  Worsening Chronicity:  New Relieved by:  Nothing Ineffective treatments: Magic Mouthwash. Associated symptoms: no arthralgias, no sneezing and no wheezing   Risk factors: no chronic respiratory disease, no immunosuppression and no sick contacts     Past Medical History  Diagnosis Date  . Migraines   . Scoliosis    Past Surgical History  Procedure Laterality Date  . Cyst removal leg     History reviewed. No pertinent family history. History  Substance Use Topics  . Smoking status: Never Smoker   . Smokeless tobacco: Never Used  . Alcohol Use: No   OB History    No data available      Review of Systems  Constitutional: Negative for fever.  HENT: Positive for congestion, rhinorrhea and sore throat. Negative for sneezing.   Respiratory: Positive for cough. Negative for wheezing.   Musculoskeletal: Negative for arthralgias.  All other systems reviewed and are  negative.   Allergies  Review of patient's allergies indicates no known allergies.  Home Medications   Prior to Admission medications   Medication Sig Start Date End Date Taking? Authorizing Provider  Alum & Mag Hydroxide-Simeth (MAGIC MOUTHWASH W/LIDOCAINE) SOLN Take 5-10 mLs by mouth 4 (four) times daily. 12/26/14   Arthor Captain, PA-C  cephALEXin (KEFLEX) 500 MG capsule Take 1 capsule (500 mg total) by mouth 4 (four) times daily. 12/26/14   Arthor Captain, PA-C  cetirizine-pseudoephedrine (ZYRTEC-D) 5-120 MG per tablet Take 1 tablet by mouth 2 (two) times daily. 02/23/15   Antony Madura, PA-C  ibuprofen (ADVIL,MOTRIN) 600 MG tablet Take 1 tablet (600 mg total) by mouth every 6 (six) hours as needed. 02/23/15   Antony Madura, PA-C  levonorgestrel-ethinyl estradiol (SEASONALE,INTROVALE,JOLESSA) 0.15-0.03 MG tablet Take 1 tablet by mouth daily.    Historical Provider, MD  rizatriptan (MAXALT) 10 MG tablet Take 1 tablet (10 mg total) by mouth as needed for migraine. May repeat in 2 hours if needed 12/26/14   Arthor Captain, PA-C  sodium chloride (OCEAN) 0.65 % SOLN nasal spray Place 1 spray into both nostrils as needed. 02/23/15   Antony Madura, PA-C   BP 119/86 mmHg  Pulse 81  Temp(Src) 98.9 F (37.2 C) (Oral)  Resp 20  Wt 151 lb (68.493 kg)  SpO2 100%  LMP 02/13/2015   Physical Exam  Constitutional: She is oriented to person, place, and time. She appears  well-developed and well-nourished. No distress.  Nontoxic/nonseptic appearing  HENT:  Head: Normocephalic and atraumatic.  Mouth/Throat: No oropharyngeal exudate.  Uvula midline. Mild posterior oropharyngeal erythema. Little to no tonsillar enlargement bilaterally. No exudates. Patient tolerating secretions without difficulty.  Eyes: Conjunctivae and EOM are normal. Pupils are equal, round, and reactive to light. No scleral icterus.  Neck: Normal range of motion.  No nuchal rigidity or meningismus  Cardiovascular: Normal rate, regular  rhythm and intact distal pulses.   Pulmonary/Chest: Effort normal and breath sounds normal. No respiratory distress. She has no wheezes. She has no rales.  Respirations even and unlabored  Musculoskeletal: Normal range of motion.  Neurological: She is alert and oriented to person, place, and time. She exhibits normal muscle tone. Coordination normal.  Skin: Skin is warm and dry. No rash noted. She is not diaphoretic. No erythema. No pallor.  Psychiatric: She has a normal mood and affect. Her behavior is normal.  Nursing note and vitals reviewed.   ED Course  Procedures (including critical care time) Labs Review Labs Reviewed - No data to display  Imaging Review Dg Chest 2 View  02/23/2015   CLINICAL DATA:  19 year old female with cough, body aches and shortness of breath for the past 4 days  EXAM: CHEST  2 VIEW  COMPARISON:  None.  FINDINGS: The lungs are clear and negative for focal airspace consolidation, pulmonary edema or suspicious pulmonary nodule. No pleural effusion or pneumothorax. Cardiac and mediastinal contours are within normal limits. No acute fracture or lytic or blastic osseous lesions. The visualized upper abdominal bowel gas pattern is unremarkable.  IMPRESSION: Normal chest x-ray.   Electronically Signed   By: Malachy MoanHeath  McCullough M.D.   On: 02/23/2015 02:48     EKG Interpretation None      MDM   Final diagnoses:  Viral URI with cough  Pharyngitis    Pt CXR negative for acute infiltrate. Patients symptoms are consistent with URI, likely viral etiology. Discussed that antibiotics are not indicated for viral infections. Pt will be discharged with symptomatic treatment. Patient verbalizes understanding and is agreeable with plan. Pt is hemodynamically stable and in NAD prior to discharge. Return precautions given and patient discharged in good condition with no unaddressed concerns.   Filed Vitals:   02/23/15 0156  BP: 119/86  Pulse: 81  Temp: 98.9 F (37.2 C)   TempSrc: Oral  Resp: 20  Weight: 151 lb (68.493 kg)  SpO2: 100%       Antony MaduraKelly Jaedin Regina, PA-C 02/23/15 0458  Dione Boozeavid Glick, MD 02/23/15 [phone]

## 2016-02-14 ENCOUNTER — Emergency Department (HOSPITAL_COMMUNITY)
Admission: EM | Admit: 2016-02-14 | Discharge: 2016-02-14 | Disposition: A | Discharge status: Home / Self Care | Payer: Worker's Compensation | Attending: Emergency Medicine | Admitting: Emergency Medicine

## 2016-02-14 ENCOUNTER — Emergency Department (HOSPITAL_COMMUNITY): Payer: Worker's Compensation

## 2016-02-14 ENCOUNTER — Encounter (HOSPITAL_COMMUNITY): Payer: Self-pay

## 2016-02-14 DIAGNOSIS — S6991XA Unspecified injury of right wrist, hand and finger(s), initial encounter: Secondary | ICD-10-CM | POA: Diagnosis not present

## 2016-02-14 DIAGNOSIS — Y9389 Activity, other specified: Secondary | ICD-10-CM | POA: Insufficient documentation

## 2016-02-14 DIAGNOSIS — M419 Scoliosis, unspecified: Secondary | ICD-10-CM | POA: Insufficient documentation

## 2016-02-14 DIAGNOSIS — Y9289 Other specified places as the place of occurrence of the external cause: Secondary | ICD-10-CM | POA: Insufficient documentation

## 2016-02-14 DIAGNOSIS — G43909 Migraine, unspecified, not intractable, without status migrainosus: Secondary | ICD-10-CM | POA: Diagnosis not present

## 2016-02-14 DIAGNOSIS — S0083XA Contusion of other part of head, initial encounter: Secondary | ICD-10-CM | POA: Diagnosis not present

## 2016-02-14 DIAGNOSIS — Z793 Long term (current) use of hormonal contraceptives: Secondary | ICD-10-CM | POA: Insufficient documentation

## 2016-02-14 DIAGNOSIS — Y998 Other external cause status: Secondary | ICD-10-CM | POA: Insufficient documentation

## 2016-02-14 DIAGNOSIS — S0990XA Unspecified injury of head, initial encounter: Secondary | ICD-10-CM | POA: Diagnosis present

## 2016-02-14 MED ORDER — HYDROCODONE-ACETAMINOPHEN 5-325 MG PO TABS
1.0000 | ORAL_TABLET | Freq: Once | ORAL | Status: AC
  Administered 2016-02-14: 1 via ORAL
  Filled 2016-02-14: qty 1

## 2016-02-14 MED ORDER — OXYCODONE-ACETAMINOPHEN 5-325 MG PO TABS
1.0000 | ORAL_TABLET | Freq: Once | ORAL | Status: AC
  Administered 2016-02-14: 1 via ORAL
  Filled 2016-02-14: qty 1

## 2016-02-14 MED ORDER — ONDANSETRON 4 MG PO TBDP: 4.0000 mg | ORAL_TABLET | Freq: Three times a day (TID) | ORAL | Status: DC | PRN

## 2016-02-14 MED ORDER — OXYCODONE-ACETAMINOPHEN 5-325 MG PO TABS: 2.0000 | ORAL_TABLET | ORAL | Status: DC | PRN

## 2016-02-14 MED ORDER — ONDANSETRON 4 MG PO TBDP
4.0000 mg | ORAL_TABLET | Freq: Once | ORAL | Status: AC
  Administered 2016-02-14: 4 mg via ORAL
  Filled 2016-02-14: qty 1

## 2016-02-14 NOTE — Discharge Instructions (Signed)
Head Injury, Adult °You have a head injury. Headaches and throwing up (vomiting) are common after a head injury. It should be easy to wake up from sleeping. Sometimes you must stay in the hospital. Most problems happen within the first 24 hours. Side effects may occur up to 7-10 days after the injury.  °WHAT ARE THE TYPES OF HEAD INJURIES? °Head injuries can be as minor as a bump. Some head injuries can be more severe. More severe head injuries include: °· A jarring injury to the brain (concussion). °· A bruise of the brain (contusion). This mean there is bleeding in the brain that can cause swelling. °· A cracked skull (skull fracture). °· Bleeding in the brain that collects, clots, and forms a bump (hematoma). °WHEN SHOULD I GET HELP RIGHT AWAY?  °· You are confused or sleepy. °· You cannot be woken up. °· You feel sick to your stomach (nauseous) or keep throwing up (vomiting). °· Your dizziness or unsteadiness is getting worse. °· You have very bad, lasting headaches that are not helped by medicine. Take medicines only as told by your doctor. °· You cannot use your arms or legs like normal. °· You cannot walk. °· You notice changes in the black spots in the center of the colored part of your eye (pupil). °· You have clear or bloody fluid coming from your nose or ears. °· You have trouble seeing. °During the next 24 hours after the injury, you must stay with someone who can watch you. This person should get help right away (call 911 in the [place] if you start to shake and are not able to control it (have seizures), you pass out, or you are unable to wake up. °HOW CAN I PREVENT A HEAD INJURY IN THE FUTURE? °· Wear seat belts. °· Wear a helmet while bike riding and playing sports like football. °· Stay away from dangerous activities around the house. °WHEN CAN I RETURN TO NORMAL ACTIVITIES AND ATHLETICS? °See your doctor before doing these activities. You should not do normal activities or play contact sports until 1  week after the following symptoms have stopped: °· Headache that does not go away. °· Dizziness. °· Poor attention. °· Confusion. °· Memory problems. °· Sickness to your stomach or throwing up. °· Tiredness. °· Fussiness. °· Bothered by bright lights or loud noises. °· Anxiousness or depression. °· Restless sleep. °MAKE SURE YOU:  °· Understand these instructions. °· Will watch your condition. °· Will get help right away if you are not doing well or get worse. °  °This information is not intended to replace advice given to you by your health care provider. Make sure you discuss any questions you have with your health care provider. °  °Document Released: 09/27/2008 Document Revised: 11/05/2014 Document Reviewed: 06/22/2013 °Elsevier Interactive Patient Education ©2016 [employer] ° °

## 2016-02-14 NOTE — ED Notes (Signed)
PT DISCHARGED. INSTRUCTIONS AND PRESCRIPTIONS GIVEN. AAOX3. PT IN NO APPARENT DISTRESS. THE OPPORTUNITY TO ASK QUESTIONS WAS PROVIDED. 
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
## 2016-04-02 ENCOUNTER — Encounter (HOSPITAL_COMMUNITY): Payer: Self-pay | Admitting: Emergency Medicine

## 2016-04-02 DIAGNOSIS — B373 Candidiasis of vulva and vagina: Secondary | ICD-10-CM | POA: Diagnosis not present

## 2016-04-02 DIAGNOSIS — O99411 Diseases of the circulatory system complicating pregnancy, first trimester: Secondary | ICD-10-CM | POA: Insufficient documentation

## 2016-04-02 DIAGNOSIS — G43909 Migraine, unspecified, not intractable, without status migrainosus: Secondary | ICD-10-CM | POA: Insufficient documentation

## 2016-04-02 DIAGNOSIS — O2301 Infections of kidney in pregnancy, first trimester: Secondary | ICD-10-CM | POA: Diagnosis not present

## 2016-04-02 DIAGNOSIS — M419 Scoliosis, unspecified: Secondary | ICD-10-CM | POA: Insufficient documentation

## 2016-04-02 DIAGNOSIS — Z3A00 Weeks of gestation of pregnancy not specified: Secondary | ICD-10-CM | POA: Insufficient documentation

## 2016-04-02 DIAGNOSIS — R000 Tachycardia, unspecified: Secondary | ICD-10-CM | POA: Insufficient documentation

## 2016-04-02 DIAGNOSIS — Z79818 Long term (current) use of other agents affecting estrogen receptors and estrogen levels: Secondary | ICD-10-CM | POA: Insufficient documentation

## 2016-04-02 DIAGNOSIS — O98811 Other maternal infectious and parasitic diseases complicating pregnancy, first trimester: Secondary | ICD-10-CM | POA: Insufficient documentation

## 2016-04-02 DIAGNOSIS — O9989 Other specified diseases and conditions complicating pregnancy, childbirth and the puerperium: Secondary | ICD-10-CM | POA: Diagnosis present

## 2016-04-02 LAB — URINALYSIS, ROUTINE W REFLEX MICROSCOPIC
Bilirubin Urine: NEGATIVE
GLUCOSE, UA: NEGATIVE mg/dL
KETONES UR: 40 mg/dL — AB
Nitrite: POSITIVE — AB
Protein, ur: NEGATIVE mg/dL
Specific Gravity, Urine: 1.015 (ref 1.005–1.030)
pH: 5.5 (ref 5.0–8.0)

## 2016-04-02 LAB — CBC
HCT: 29.7 % — ABNORMAL LOW (ref 36.0–46.0)
HEMOGLOBIN: 9.9 g/dL — AB (ref 12.0–15.0)
MCH: 28.6 pg (ref 26.0–34.0)
MCHC: 33.3 g/dL (ref 30.0–36.0)
MCV: 85.8 fL (ref 78.0–100.0)
Platelets: 231 10*3/uL (ref 150–400)
RBC: 3.46 MIL/uL — ABNORMAL LOW (ref 3.87–5.11)
RDW: 12.7 % (ref 11.5–15.5)
WBC: 13.2 10*3/uL — ABNORMAL HIGH (ref 4.0–10.5)

## 2016-04-02 LAB — BASIC METABOLIC PANEL
Anion gap: 8 (ref 5–15)
CHLORIDE: 102 mmol/L (ref 101–111)
CO2: 23 mmol/L (ref 22–32)
CREATININE: 0.5 mg/dL (ref 0.44–1.00)
Calcium: 9.5 mg/dL (ref 8.9–10.3)
GFR calc Af Amer: >60 mL/min (ref >60)
GFR calc non Af Amer: >60 mL/min (ref >60)
GLUCOSE: 91 mg/dL (ref 65–99)
Potassium: 3.6 mmol/L (ref 3.5–5.1)
SODIUM: 133 mmol/L — AB (ref 135–145)

## 2016-04-02 LAB — URINE MICROSCOPIC-ADD ON

## 2016-04-02 LAB — POC URINE PREG, ED: Preg Test, Ur: POSITIVE — AB

## 2016-04-02 NOTE — ED Notes (Signed)
Patient with right flank pain and lower back pain that started Saturday morning.  Patient states she has been feeling feverish.  No nausea or vomiting.  Patient states she has had urinary frequency since that day.  Patient states she has been drinking water to help, she has not really eaten anything.

## 2016-04-03 ENCOUNTER — Emergency Department (HOSPITAL_COMMUNITY)
Admission: EM | Admit: 2016-04-03 | Discharge: 2016-04-03 | Disposition: A | Discharge status: Home / Self Care | Attending: Emergency Medicine | Admitting: Emergency Medicine

## 2016-04-03 DIAGNOSIS — B373 Candidiasis of vulva and vagina: Secondary | ICD-10-CM

## 2016-04-03 DIAGNOSIS — Z3490 Encounter for supervision of normal pregnancy, unspecified, unspecified trimester: Secondary | ICD-10-CM

## 2016-04-03 DIAGNOSIS — N12 Tubulo-interstitial nephritis, not specified as acute or chronic: Secondary | ICD-10-CM

## 2016-04-03 DIAGNOSIS — B3731 Acute candidiasis of vulva and vagina: Secondary | ICD-10-CM

## 2016-04-03 LAB — WET PREP, GENITAL
SPERM: NONE SEEN
TRICH WET PREP: NONE SEEN

## 2016-04-03 LAB — HIV ANTIBODY (ROUTINE TESTING W REFLEX): HIV SCREEN 4TH GENERATION: NONREACTIVE

## 2016-04-03 LAB — HCG, QUANTITATIVE, PREGNANCY: hCG, Beta Chain, Quant, S: 32778 m[IU]/mL — ABNORMAL HIGH (ref <5)

## 2016-04-03 LAB — RPR: RPR Ser Ql: NONREACTIVE

## 2016-04-03 MED ORDER — SODIUM CHLORIDE 0.9 % IV BOLUS (SEPSIS)
1000.0000 mL | Freq: Once | INTRAVENOUS | Status: AC
  Administered 2016-04-03: 1000 mL via INTRAVENOUS

## 2016-04-03 MED ORDER — METOCLOPRAMIDE HCL 10 MG PO TABS: 10.0000 mg | ORAL_TABLET | Freq: Four times a day (QID) | ORAL | Status: DC | PRN

## 2016-04-03 MED ORDER — CEPHALEXIN 500 MG PO CAPS: 1000.0000 mg | ORAL_CAPSULE | Freq: Two times a day (BID) | ORAL | Status: DC

## 2016-04-03 MED ORDER — CLOTRIMAZOLE 1 % VA CREA
1.0000 | TOPICAL_CREAM | Freq: Every day | VAGINAL | Status: DC
Start: 2016-04-03 — End: 2018-06-05

## 2016-04-03 MED ORDER — PRENATAL COMPLETE 14-0.4 MG PO TABS: 1.0000 | ORAL_TABLET | Freq: Every day | ORAL | Status: DC

## 2016-04-03 MED ORDER — DEXTROSE 5 % IV SOLN
1.0000 g | Freq: Once | INTRAVENOUS | Status: AC
  Administered 2016-04-03: 1 g via INTRAVENOUS
  Filled 2016-04-03: qty 10

## 2016-04-03 MED ORDER — ACETAMINOPHEN 500 MG PO TABS
1000.0000 mg | ORAL_TABLET | Freq: Once | ORAL | Status: AC
  Administered 2016-04-03: 1000 mg via ORAL
  Filled 2016-04-03: qty 2

## 2016-04-03 NOTE — Discharge Instructions (Signed)
First Trimester of Pregnancy  The first trimester of pregnancy is from week 1 until the end of week 12 (months 1 through 3). A week after a sperm fertilizes an egg, the egg will implant on the wall of the uterus. This embryo will begin to develop into a baby. Genes from you and your partner are forming the baby. The female genes determine whether the baby is a boy or a girl. At 6-8 weeks, the eyes and face are formed, and the heartbeat can be seen on ultrasound. At the end of 12 weeks, all the baby's organs are formed.   Now that you are pregnant, you will want to do everything you can to have a healthy baby. Two of the most important things are to get good prenatal care and to follow your health care provider's instructions. Prenatal care is all the medical care you receive before the baby's birth. This care will help prevent, find, and treat any problems during the pregnancy and childbirth.  BODY CHANGES  Your body goes through many changes during pregnancy. The changes vary from woman to woman.   · You may gain or lose a couple of pounds at first.  · You may feel sick to your stomach (nauseous) and throw up (vomit). If the vomiting is uncontrollable, call your health care provider.  · You may tire easily.  · You may develop headaches that can be relieved by medicines approved by your health care provider.  · You may urinate more often. Painful urination may mean you have a bladder infection.  · You may develop heartburn as a result of your pregnancy.  · You may develop constipation because certain hormones are causing the muscles that push waste through your intestines to slow down.  · You may develop hemorrhoids or swollen, bulging veins (varicose veins).  · Your breasts may begin to grow larger and become tender. Your nipples may stick out more, and the tissue that surrounds them (areola) may become darker.  · Your gums may bleed and may be sensitive to brushing and flossing.   · Dark spots or blotches (chloasma, mask of pregnancy) may develop on your face. This will likely fade after the baby is born.  · Your menstrual periods will stop.  · You may have a loss of appetite.  · You may develop cravings for certain kinds of food.  · You may have changes in your emotions from day to day, such as being excited to be pregnant or being concerned that something may go wrong with the pregnancy and baby.  · You may have more vivid and strange dreams.  · You may have changes in your hair. These can include thickening of your hair, rapid growth, and changes in texture. Some women also have hair loss during or after pregnancy, or hair that feels dry or thin. Your hair will most likely return to normal after your baby is born.  WHAT TO EXPECT AT YOUR PRENATAL VISITS  During a routine prenatal visit:  · You will be weighed to make sure you and the baby are growing normally.  · Your blood pressure will be taken.  · Your abdomen will be measured to track your baby's growth.  · The fetal heartbeat will be listened to starting around week 10 or 12 of your pregnancy.  · Test results from any previous visits will be discussed.  Your health care provider may ask you:  · How you are feeling.  · If you   including cigarettes, chewing tobacco, and electronic cigarettes. °· If you have any questions. °Other tests that may be performed during your first trimester include: °· Blood tests to find your blood type and to check for the presence of any previous infections. They will also be used to check for low iron levels (anemia) and Rh antibodies. Later in the pregnancy, blood tests for diabetes will be done along with other tests if problems develop. °· Urine tests to check for infections, diabetes, or protein in the urine. °· An ultrasound to confirm the proper growth  and development of the baby. °· An amniocentesis to check for possible genetic problems. °· Fetal screens for spina bifida and Down syndrome. °· You may need other tests to make sure you and the baby are doing well. °· HIV (human immunodeficiency virus) testing. Routine prenatal testing includes screening for HIV, unless you choose not to have this test. °HOME CARE INSTRUCTIONS  °Medicines °· Follow your health care provider's instructions regarding medicine use. Specific medicines may be either safe or unsafe to take during pregnancy. °· Take your prenatal vitamins as directed. °· If you develop constipation, try taking a stool softener if your health care provider approves. °Diet °· Eat regular, well-balanced meals. Choose a variety of foods, such as meat or vegetable-based protein, fish, milk and low-fat dairy products, vegetables, fruits, and whole grain breads and cereals. Your health care provider will help you determine the amount of weight gain that is right for you. °· Avoid raw meat and uncooked cheese. These carry germs that can cause birth defects in the baby. °· Eating four or five small meals rather than three large meals a day may help relieve nausea and vomiting. If you start to feel nauseous, eating a few soda crackers can be helpful. Drinking liquids between meals instead of during meals also seems to help nausea and vomiting. °· If you develop constipation, eat more high-fiber foods, such as fresh vegetables or fruit and whole grains. Drink enough fluids to keep your urine clear or pale yellow. °Activity and Exercise °· Exercise only as directed by your health care provider. Exercising will help you: °· Control your weight. °· Stay in shape. °· Be prepared for labor and delivery. °· Experiencing pain or cramping in the lower abdomen or low back is a good sign that you should stop exercising. Check with your health care provider before continuing normal exercises. °· Try to avoid standing for long  periods of time. Move your legs often if you must stand in one place for a long time. °· Avoid heavy lifting. °· Wear low-heeled shoes, and practice good posture. °· You may continue to have sex unless your health care provider directs you otherwise. °Relief of Pain or Discomfort °· Wear a good support bra for breast tenderness.   °· Take warm sitz baths to soothe any pain or discomfort caused by hemorrhoids. Use hemorrhoid cream if your health care provider approves.   °· Rest with your legs elevated if you have leg cramps or low back pain. °· If you develop varicose veins in your legs, wear support hose. Elevate your feet for 15 minutes, 3-4 times a day. Limit salt in your diet. °Prenatal Care °· Schedule your prenatal visits by the twelfth week of pregnancy. They are usually scheduled monthly at first, then more often in the last 2 months before delivery. °· Write down your questions. Take them to your prenatal visits. °· Keep all your prenatal visits as directed by your   health care provider. Safety  Wear your seat belt at all times when driving.  Make a list of emergency phone numbers, including numbers for family, friends, the hospital, and police and fire departments. General Tips  Ask your health care provider for a referral to a local prenatal education class. Begin classes no later than at the beginning of month 6 of your pregnancy.  Ask for help if you have counseling or nutritional needs during pregnancy. Your health care provider can offer advice or refer you to specialists for help with various needs.  Do not use hot tubs, steam rooms, or saunas.  Do not douche or use tampons or scented sanitary pads.  Do not cross your legs for long periods of time.  Avoid cat litter boxes and soil used by cats. These carry germs that can cause birth defects in the baby and possibly loss of the fetus by miscarriage or stillbirth.  Avoid all smoking, herbs, alcohol, and medicines not prescribed by  your health care provider. Chemicals in these affect the formation and growth of the baby.  Do not use any tobacco products, including cigarettes, chewing tobacco, and electronic cigarettes. If you need help quitting, ask your health care provider. You may receive counseling support and other resources to help you quit.  Schedule a dentist appointment. At home, brush your teeth with a soft toothbrush and be gentle when you floss. SEEK MEDICAL CARE IF:   You have dizziness.  You have mild pelvic cramps, pelvic pressure, or nagging pain in the abdominal area.  You have persistent nausea, vomiting, or diarrhea.  You have a bad smelling vaginal discharge.  You have pain with urination.  You notice increased swelling in your face, hands, legs, or ankles. SEEK IMMEDIATE MEDICAL CARE IF:   You have a fever.  You are leaking fluid from your vagina.  You have spotting or bleeding from your vagina.  You have severe abdominal cramping or pain.  You have rapid weight gain or loss.  You vomit blood or material that looks like coffee grounds.  You are exposed to [place] measles and have never had them.  You are exposed to fifth disease or chickenpox.  You develop a severe headache.  You have shortness of breath.  You have any kind of trauma, such as from a fall or a car accident.   This information is not intended to replace advice given to you by your health care provider. Make sure you discuss any questions you have with your health care provider.   Document Released: 10/09/2001 Document Revised: 11/05/2014 Document Reviewed: 08/25/2013 Elsevier Interactive Patient Education 2016 [employer] Inc[employer]  Pyelonephritis, Adult Pyelonephritis is a kidney infection. The kidneys are the organs that filter a person's blood and move waste out of the bloodstream and into the urine. Urine passes from the kidneys, through the ureters, and into the bladder. There are two main types of  pyelonephritis:  Infections that come on quickly without any warning (acute pyelonephritis).  Infections that last for a long period of time (chronic pyelonephritis). In most cases, the infection clears up with treatment and does not cause further problems. More severe infections or chronic infections can sometimes spread to the bloodstream or lead to other problems with the kidneys. CAUSES This condition is usually caused by:  Bacteria traveling from the bladder to the kidney through infected urine. The urine in the bladder can become infected with bacteria from:  Bladder infection (cystitis).  Inflammation of the prostate gland (prostatitis).  Sexual intercourse, in females.  Bacteria traveling from the bloodstream to the kidney. RISK FACTORS This condition is more likely to develop in:  Pregnant women.  Older people.  People who have diabetes.  People who have kidney stones or bladder stones.  People who have other abnormalities of the kidney or ureter.  People who have a catheter placed in the bladder.  People who have cancer.  People who are sexually active.  Women who use spermicides.  People who have had a prior urinary tract infection. SYMPTOMS Symptoms of this condition include:  Frequent urination.  Strong or persistent urge to urinate.  Burning or stinging when urinating.  Abdominal pain.  Back pain.  Pain in the side or flank area.  Fever.  Chills.  Blood in the urine, or dark urine.  Nausea.  Vomiting. DIAGNOSIS This condition may be diagnosed based on:  Medical history and physical exam.  Urine tests.  Blood tests. You may also have imaging tests of the kidneys, such as an ultrasound or CT scan. TREATMENT Treatment for this condition may depend on the severity of the infection.  If the infection is mild and is found early, you may be treated with antibiotic medicines taken by mouth. You will need to drink fluids to remain  hydrated.  If the infection is more severe, you may need to stay in the hospital and receive antibiotics given directly into a vein through an IV tube. You may also need to receive fluids through an IV tube if you are not able to remain hydrated. After your hospital stay, you may need to take oral antibiotics for a period of time. Other treatments may be required, depending on the cause of the infection. HOME CARE INSTRUCTIONS Medicines  Take over-the-counter and prescription medicines only as told by your health care provider.  If you were prescribed an antibiotic medicine, take it as told by your health care provider. Do not stop taking the antibiotic even if you start to feel better. General Instructions  Drink enough fluid to keep your urine clear or pale yellow.  Avoid caffeine, tea, and carbonated beverages. They tend to irritate the bladder.  Urinate often. Avoid holding in urine for long periods of time.  Urinate before and after sex.  After a bowel movement, women should cleanse from front to back. Use each tissue only once.  Keep all follow-up visits as told by your health care provider. This is important. SEEK MEDICAL CARE IF:  Your symptoms do not get better after 2 days of treatment.  Your symptoms get worse.  You have a fever. SEEK IMMEDIATE MEDICAL CARE IF:  You are unable to take your antibiotics or fluids.  You have shaking chills.  You vomit.  You have severe flank or back pain.  You have extreme weakness or fainting.   This information is not intended to replace advice given to you by your health care provider. Make sure you discuss any questions you have with your health care provider.   Document Released: 10/15/2005 Document Revised: 07/06/2015 Document Reviewed: 02/07/2015 Elsevier Interactive Patient Education 2016 Else[employer] Inc.  Monilial Vaginitis Vaginitis in a soreness, swelling and redness (inflammation) of the vagina and vulva. Monilial  vaginitis is not a sexually transmitted infection. CAUSES  Yeast vaginitis is caused by yeast (candida) that is normally found in your vagina. With a yeast infection, the candida has overgrown in number to a point that upsets the chemical balance. SYMPTOMS   White, thick vaginal discharge.  Swelling, itching, redness and irritation of the vagina and possibly the lips of the vagina (vulva).  Burning or painful urination.  Painful intercourse. DIAGNOSIS  Things that may contribute to monilial vaginitis are:  Postmenopausal and virginal states.  Pregnancy.  Infections.  Being tired, sick or stressed, especially if you had monilial vaginitis in the past.  Diabetes. Good control will help lower the chance.  Birth control pills.  Tight fitting garments.  Using bubble bath, feminine sprays, douches or deodorant tampons.  Taking certain medications that kill germs (antibiotics).  Sporadic recurrence can occur if you become ill. TREATMENT  Your caregiver will give you medication.  There are several kinds of anti monilial vaginal creams and suppositories specific for monilial vaginitis. For recurrent yeast infections, use a suppository or cream in the vagina 2 times a week, or as directed.  Anti-monilial or steroid cream for the itching or irritation of the vulva may also be used. Get your caregiver's permission.  Painting the vagina with methylene blue solution may help if the monilial cream does not work.  Eating yogurt may help prevent monilial vaginitis. HOME CARE INSTRUCTIONS   Finish all medication as prescribed.  Do not have sex until treatment is completed or after your caregiver tells you it is okay.  Take warm sitz baths.  Do not douche.  Do not use tampons, especially scented ones.  Wear cotton underwear.  Avoid tight pants and panty hose.  Tell your sexual partner that you have a yeast infection. They should go to their caregiver if they have symptoms  such as mild rash or itching.  Your sexual partner should be treated as well if your infection is difficult to eliminate.  Practice safer sex. Use condoms.  Some vaginal medications cause latex condoms to fail. Vaginal medications that harm condoms are:  Cleocin cream.  Butoconazole (Femstat).  Terconazole (Terazol) vaginal suppository.  Miconazole (Monistat) (may be purchased over the counter). SEEK MEDICAL CARE IF:   You have a temperature by mouth above 102 F (38.9 C).  The infection is getting worse after 2 days of treatment.  The infection is not getting better after 3 days of treatment.  You develop blisters in or around your vagina.  You develop vaginal bleeding, and it is not your menstrual period.  You have pain when you urinate.  You develop intestinal problems.  You have pain with sexual intercourse.   This information is not intended to replace advice given to you by your health care provider. Make sure you discuss any questions you have with your health care provider.   Document Released: 07/25/2005 Document Revised: 01/07/2012 Document Reviewed: 04/18/2015 Elsevier Interactive Patient Education [employer]

## 2016-04-03 NOTE — ED Provider Notes (Signed)
CSN: [id number]     Arrival date & time 04/02/16  2257 History  By signing my name below, I, Arianna Nassar & Ginette Pitman, attest that this documentation has been prepared under the direction and in the presence of [name], MD. Electronically Signed: Octavia Heir, ED Scribe. 04/03/2016. 4:14 AM.  Chief Complaint  Patient presents with  . Flank Pain  . Back Pain    The history is provided by the patient. No language interpreter was used.   HPI Comments: Sharon Powell is a 20 y.o. female who presents to the Emergency Department complaining of sudden onset, gradual worsening, moderate, right flank pain onset three days go.  She reports associated urinary frequency, dysuria, subjective fever, and lower back pain. Pt says that her last menstrual cycle was the end of May. She denies diarrhea, nausea, or vomiting.  Past Medical History  Diagnosis Date  . Migraines   . Scoliosis    Past Surgical History  Procedure Laterality Date  . Cyst removal leg     No family history on file. Social History  Substance Use Topics  . Smoking status: Never Smoker   . Smokeless tobacco: Never Used  . Alcohol Use: No   OB History    No data available     Review of Systems  Constitutional: Negative for fever.  Gastrointestinal: Negative for nausea, vomiting and diarrhea.  Genitourinary: Positive for dysuria, frequency and flank pain.  Musculoskeletal: Positive for back pain.  All other systems reviewed and are negative.     Allergies  Review of patient's allergies indicates no known allergies.  Home Medications   Prior to Admission medications   Medication Sig Start Date End Date Taking? Authorizing Provider  cephALEXin (KEFLEX) 500 MG capsule Take 2 capsules (1,000 mg total) by mouth 2 (two) times daily. 04/03/16   [name], MD  clotrimazole (GYNE-LOTRIMIN) 1 % vaginal cream Place 1 Applicatorful vaginally at bedtime. 04/03/16   [name], MD  ibuprofen  (ADVIL,MOTRIN) 600 MG tablet Take 1 tablet (600 mg total) by mouth every 6 (six) hours as needed. 02/23/15   Antony Madura, PA-C  levonorgestrel-ethinyl estradiol (SEASONALE,INTROVALE,JOLESSA) 0.15-0.03 MG tablet Take 1 tablet by mouth daily.    Historical Provider, MD  metoCLOPramide (REGLAN) 10 MG tablet Take 1 tablet (10 mg total) by mouth every 6 (six) hours as needed for nausea (nausea/headache). 04/03/16   [name], MD  ondansetron (ZOFRAN ODT) 4 MG disintegrating tablet Take 1 tablet (4 mg total) by mouth every 8 (eight) hours as needed for nausea. 02/14/16   Rolland Porter, MD  oxyCODONE-acetaminophen (PERCOCET/ROXICET) 5-325 MG tablet Take 2 tablets by mouth every 4 (four) hours as needed. 02/14/16   Rolland Porter, MD  rizatriptan (MAXALT) 10 MG tablet Take 1 tablet (10 mg total) by mouth as needed for migraine. May repeat in 2 hours if needed 12/26/14   Arthor Captain, PA-C   Triage Vitals: BP 113/71 mmHg  Pulse 115  Temp(Src) 99.4 F (37.4 C) (Oral)  Resp 12  Ht  (1.651 m)  Wt 149 lb 6 oz (67.756 kg)  BMI 24.86 kg/m2  SpO2 100%  LMP 03/25/2016 (Approximate) Physical Exam  Constitutional: She is oriented to person, place, and time. She appears well-developed and well-nourished. No distress.  HENT:  Head: Normocephalic and atraumatic.  Right Ear: Hearing normal.  Left Ear: Hearing normal.  Nose: Nose normal.  Mouth/Throat: Oropharynx is clear and moist and mucous membranes are normal.  Eyes: Conjunctivae  and EOM are normal. Pupils are equal, round, and reactive to light.  Neck: Normal range of motion. Neck supple.  Cardiovascular: Regular rhythm, S1 normal, S2 normal, normal heart sounds and intact distal pulses.  Tachycardia present.  Exam reveals no gallop and no friction rub.   No murmur heard. regular tachycardia  Pulmonary/Chest: Effort normal and breath sounds normal. No respiratory distress. She exhibits no tenderness.  Abdominal: Soft. Normal appearance and bowel  sounds are normal. There is no hepatosplenomegaly. There is no tenderness. There is CVA tenderness. There is no rebound, no guarding, no tenderness at McBurney's point and negative Murphy's sign. No hernia.  Right CVA tenderness, no abdominal tenderness   Musculoskeletal: Normal range of motion.  Neurological: She is alert and oriented to person, place, and time. She has normal strength. No cranial nerve deficit or sensory deficit. Coordination normal. GCS eye subscore is 4. GCS verbal subscore is 5. GCS motor subscore is 6.  Skin: Skin is warm, dry and intact. No rash noted. No cyanosis.  Psychiatric: She has a normal mood and affect. Her speech is normal and behavior is normal. Thought content normal.  Nursing note and vitals reviewed.   ED Course  Procedures DIAGNOSTIC STUDIES: Oxygen Saturation is 100% on RA, normal by my interpretation.  COORDINATION OF CARE: 4:14 AM Discussed treatment plan with pt at bedside and pt agreed to plan.  Labs Review Labs Reviewed  WET PREP, GENITAL - Abnormal; Notable for the following:    Yeast Wet Prep HPF POC PRESENT (*)    Clue Cells Wet Prep HPF POC PRESENT (*)    WBC, Wet Prep HPF POC MANY (*)    All other components within normal limits  URINALYSIS, ROUTINE W REFLEX MICROSCOPIC (NOT AT [facility]) - Abnormal; Notable for the following:    APPearance TURBID (*)    Hgb urine dipstick SMALL (*)    Ketones, ur 40 (*)    Nitrite POSITIVE (*)    Leukocytes, UA LARGE (*)    All other components within normal limits  CBC - Abnormal; Notable for the following:    WBC 13.2 (*)    RBC 3.46 (*)    Hemoglobin 9.9 (*)    HCT 29.7 (*)    All other components within normal limits  BASIC METABOLIC PANEL - Abnormal; Notable for the following:    Sodium 133 (*)    BUN <5 (*)    All other components within normal limits  URINE MICROSCOPIC-ADD ON - Abnormal; Notable for the following:    Squamous Epithelial / LPF 6-30 (*)    Bacteria, UA MANY (*)    All  other components within normal limits  HCG, QUANTITATIVE, PREGNANCY - Abnormal; Notable for the following:    hCG, Beta Chain, Quant, S [id number] (*)    All other components within normal limits  POC URINE PREG, ED - Abnormal; Notable for the following:    Preg Test, Ur POSITIVE (*)    All other components within normal limits  URINE CULTURE  RPR  HIV ANTIBODY (ROUTINE TESTING)  GC/CHLAMYDIA PROBE AMP ([place]) NOT AT [facility]    Imaging Review No results found. I have personally reviewed and evaluated these images and lab results as part of my medical decision-making.   EKG Interpretation None      MDM   Final diagnoses:  Pyelonephritis  Pregnancy  Vaginal yeast infection    Patient presents to the ER for evaluation of right flank pain. Symptoms began several  days ago. She has been feeling feverish and has been experiencing urinary frequency and dysuria. Urinalysis shows obvious infection.MAXIMUM TEMPERATURE here in the ER has been 99.7. She has not been expressing nausea and vomiting. She has been tolerating oral intake here in the ER. Patient administered IV Rocephin for treatment of infection. She was noted to have mild tachycardia. This is likely multifactorial. Tachycardia secondary to infection, likely low-grade fever, pain. She has not been hypotensive, no suspicion for sepsis.  Patient found to be pregnant incidentally. She is not experiencing any abdominal or pelvic pain. There has not been any vaginal bleeding. She does report a normal menstrual period in May. Pelvic exam was unremarkable. No concern for obstetric complication.  I personally performed the services described in this documentation, which was scribed in my presence. The recorded information has been reviewed and is accurate.    [name], MD 04/03/16 [phone]

## 2016-04-04 ENCOUNTER — Emergency Department (HOSPITAL_COMMUNITY)
Admission: EM | Admit: 2016-04-04 | Discharge: 2016-04-04 | Disposition: A | Discharge status: Home / Self Care | Attending: Emergency Medicine | Admitting: Emergency Medicine

## 2016-04-04 ENCOUNTER — Encounter (HOSPITAL_COMMUNITY): Payer: Self-pay | Admitting: Emergency Medicine

## 2016-04-04 DIAGNOSIS — G4489 Other headache syndrome: Secondary | ICD-10-CM | POA: Insufficient documentation

## 2016-04-04 DIAGNOSIS — R51 Headache: Secondary | ICD-10-CM | POA: Diagnosis present

## 2016-04-04 DIAGNOSIS — Z792 Long term (current) use of antibiotics: Secondary | ICD-10-CM | POA: Insufficient documentation

## 2016-04-04 DIAGNOSIS — Z791 Long term (current) use of non-steroidal anti-inflammatories (NSAID): Secondary | ICD-10-CM | POA: Diagnosis not present

## 2016-04-04 DIAGNOSIS — Z79899 Other long term (current) drug therapy: Secondary | ICD-10-CM | POA: Diagnosis not present

## 2016-04-04 DIAGNOSIS — M542 Cervicalgia: Secondary | ICD-10-CM | POA: Insufficient documentation

## 2016-04-04 LAB — GC/CHLAMYDIA PROBE AMP (~~LOC~~) NOT AT ARMC
CHLAMYDIA, DNA PROBE: NEGATIVE
NEISSERIA GONORRHEA: NEGATIVE

## 2016-04-04 MED ORDER — METOCLOPRAMIDE HCL 10 MG PO TABS: 10.0000 mg | ORAL_TABLET | Freq: Four times a day (QID) | ORAL | Status: DC

## 2016-04-04 MED ORDER — SODIUM CHLORIDE 0.9 % IV BOLUS (SEPSIS)
1000.0000 mL | Freq: Once | INTRAVENOUS | Status: AC
  Administered 2016-04-04: 1000 mL via INTRAVENOUS

## 2016-04-04 MED ORDER — PRENATAL COMPLETE 14-0.4 MG PO TABS: 1.0000 | ORAL_TABLET | Freq: Every day | ORAL | Status: DC

## 2016-04-04 MED ORDER — DEXAMETHASONE SODIUM PHOSPHATE 10 MG/ML IJ SOLN
10.0000 mg | Freq: Once | INTRAMUSCULAR | Status: AC
  Administered 2016-04-04: 10 mg via INTRAVENOUS
  Filled 2016-04-04: qty 1

## 2016-04-04 MED ORDER — METOCLOPRAMIDE HCL 5 MG/ML IJ SOLN
10.0000 mg | Freq: Once | INTRAMUSCULAR | Status: AC
  Administered 2016-04-04: 10 mg via INTRAVENOUS
  Filled 2016-04-04: qty 2

## 2016-04-04 MED ORDER — METOCLOPRAMIDE HCL 10 MG PO TABS: 10.0000 mg | ORAL_TABLET | Freq: Four times a day (QID) | ORAL | Status: DC | PRN

## 2016-04-04 MED ORDER — DIPHENHYDRAMINE HCL 50 MG/ML IJ SOLN
25.0000 mg | Freq: Once | INTRAMUSCULAR | Status: AC
  Administered 2016-04-04: 25 mg via INTRAVENOUS
  Filled 2016-04-04: qty 1

## 2016-04-04 MED ORDER — ACETAMINOPHEN 325 MG PO TABS
650.0000 mg | ORAL_TABLET | Freq: Once | ORAL | Status: AC
Start: 2016-04-04 — End: 2016-04-04
  Administered 2016-04-04: 650 mg via ORAL
  Filled 2016-04-04: qty 2

## 2016-04-04 NOTE — ED Notes (Signed)
Pt ambulated to room from waiting room. 

## 2016-04-04 NOTE — ED Provider Notes (Signed)
CSN: [id number]     Arrival date & time 04/04/16  1224 History   First MD Initiated Contact with Patient 04/04/16 1641     Chief Complaint  Patient presents with  . Headache  . Neck Pain   Patient is a 20 y.o. female presenting with headaches.  Headache Pain location:  Generalized Quality:  Sharp and stabbing Radiates to:  Does not radiate Severity currently:  10/10 Severity at highest:  10/10 Onset quality:  Gradual Duration:  1 day Timing:  Constant Progression:  Worsening Chronicity:  Recurrent Similar to prior headaches: no (more severe than usual)   Context: bright light   Context: not coughing   Relieved by:  Nothing Worsened by:  Light Ineffective treatments:  NSAIDs and prescription medications (maxalt) Associated symptoms: fatigue, nausea, neck pain (back of neck, bilateral, worse with palpation) and photophobia   Associated symptoms: no abdominal pain, no back pain, no blurred vision, no cough, no diarrhea, no dizziness, no ear pain, no eye pain, no facial pain, no fever, no near-syncope, no neck stiffness, no numbness, no paresthesias, no sinus pressure, no sore throat, no swollen glands, no URI, no visual change, no vomiting and no weakness     Past Medical History  Diagnosis Date  . Migraines   . Scoliosis    Past Surgical History  Procedure Laterality Date  . Cyst removal leg     No family history on file. Social History  Substance Use Topics  . Smoking status: Never Smoker   . Smokeless tobacco: Never Used  . Alcohol Use: Yes     Comment: occasional    OB History    No data available     Review of Systems  Constitutional: Positive for fatigue. Negative for fever.  HENT: Negative for ear pain, sinus pressure and sore throat.   Eyes: Positive for photophobia. Negative for blurred vision and pain.  Respiratory: Negative for cough.   Cardiovascular: Negative for near-syncope.  Gastrointestinal: Positive for nausea. Negative for vomiting, abdominal  pain and diarrhea.  Musculoskeletal: Positive for neck pain (back of neck, bilateral, worse with palpation). Negative for back pain and neck stiffness.  Neurological: Positive for headaches. Negative for dizziness, weakness, numbness and paresthesias.   Allergies  Review of patient's allergies indicates no known allergies.  Home Medications   Prior to Admission medications   Medication Sig Start Date End Date Taking? Authorizing Provider  cephALEXin (KEFLEX) 500 MG capsule Take 2 capsules (1,000 mg total) by mouth 2 (two) times daily. 04/03/16  Yes [name], MD  ibuprofen (ADVIL,MOTRIN) 600 MG tablet Take 1 tablet (600 mg total) by mouth every 6 (six) hours as needed. 02/23/15  Yes Antony Madura, PA-C  levonorgestrel-ethinyl estradiol (SEASONALE,INTROVALE,JOLESSA) 0.15-0.03 MG tablet Take 1 tablet by mouth daily.   Yes Historical Provider, MD  clotrimazole (GYNE-LOTRIMIN) 1 % vaginal cream Place 1 Applicatorful vaginally at bedtime. Patient not taking: Reported on 04/04/2016 04/03/16   [name], MD  metoCLOPramide (REGLAN) 10 MG tablet Take 1 tablet (10 mg total) by mouth every 6 (six) hours. 04/04/16   Sidney Ace, MD  metoCLOPramide (REGLAN) 10 MG tablet Take 1 tablet (10 mg total) by mouth every 6 (six) hours as needed (for migraine headache). 04/04/16   Sidney Ace, MD  ondansetron (ZOFRAN ODT) 4 MG disintegrating tablet Take 1 tablet (4 mg total) by mouth every 8 (eight) hours as needed for nausea. 02/14/16   Rolland Porter, MD  oxyCODONE-acetaminophen (PERCOCET/ROXICET) 5-325 MG tablet  Take 2 tablets by mouth every 4 (four) hours as needed. 02/14/16   Rolland PorterMark James, MD  Prenatal Vit-Fe Fumarate-FA (PRENATAL COMPLETE) 14-0.4 MG TABS Take 1 tablet by mouth daily. 04/04/16   Sidney AceAlison Charruf Louden Houseworth, MD   BP 105/76 mmHg  Pulse 98  Temp(Src) 98 F (36.7 C) (Oral)  Resp 18  Ht 5\' 6"  (1.676 m)  Wt 67.671 kg  BMI 24.09 kg/m2  SpO2 100%  LMP 03/25/2016  (Approximate) Physical Exam  Constitutional: She is oriented to person, place, and time. She appears well-developed and well-nourished. She appears distressed (crying, dark room, covering face with blanket).  HENT:  Head: Normocephalic and atraumatic.  Right Ear: External ear normal.  Left Ear: External ear normal.  Mouth/Throat: Oropharynx is clear and moist. No oropharyngeal exudate.  Tympanic membranes without bulging erythema. No lymphadenopathy of neck. Full range of motion of neck.   Eyes: Conjunctivae are normal. Pupils are equal, round, and reactive to light. Right eye exhibits no discharge. Left eye exhibits no discharge.  Neck: Normal range of motion. Neck supple.  Cardiovascular: Normal rate and regular rhythm.   Pulmonary/Chest: Effort normal and breath sounds normal. No respiratory distress.  Abdominal: Soft. Bowel sounds are normal. She exhibits no distension and no mass. There is no tenderness. There is no rebound and no guarding.  Musculoskeletal: She exhibits no edema.  Lymphadenopathy:    She has no cervical adenopathy.  Neurological: She is alert and oriented to person, place, and time. She displays normal reflexes. No cranial nerve deficit. She exhibits normal muscle tone. Coordination normal.  Number finger-to-nose Negative Romberg Normal gait  Skin: Skin is warm. No rash noted.  Psychiatric: She has a normal mood and affect.  Nursing note and vitals reviewed. Patient unable to tolerate ophthalmoscopic exam 2ry to photophobia  ED Course  Procedures (including critical care time) Labs Review Labs Reviewed - No data to display  Imaging Review No results found. I have personally reviewed and evaluated these images and lab results as part of my medical decision-making.   EKG Interpretation None      MDM   Final diagnoses:  Other headache syndrome    Likely migraine headache. No focal neurological deficits to suggest a intracranial process. No recent  trauma. No neck stiffness fever or confusion to suggest meningitis. No recent trauma to suggest fracture. HEENT exam without evidence of infection. Patient is in her first trimester and doubt eclampsia. No visual changes to suggest glaucoma. Patient may follow up with primary care provider and instructed to establish care with OB. Instructed to stop her triptan and start instead Reglan and Tylenol for migraines given her pregnancy. Return for any focal weakness numbness, intractable vomiting, fevers or neck stiffness. Patient discharged in good condition.    Sidney AceAlison Charruf Jamonica Schoff, MD 04/04/16 1910  Doug SouSam Jacubowitz, MD 04/05/16 [phone]

## 2016-04-04 NOTE — ED Notes (Signed)
Pt arrives via POv from home with headache described as shoking sensation. Also c/o neck pain. Pt with hx of headaches and took prescribed HA medication without relief. Pt c/o nausea, vomiting, light sensitivity. Pt awake, alert, oriented x4, VSS.

## 2016-04-04 NOTE — ED Provider Notes (Signed)
[place] of gradual onset headache feels like "electric shocks," constant onset yesterday accompanied by photophobia and nausea.. Other associated symptoms include left-sided paracervical neck pain. No other associated symptoms. On exam alert Glasgow Coma Score 15 HEENT exam no facial asymmetry neck supple with no signs of meningitis. Neurologic cranial nerves II through XII grossly intact. Moves all extremity is well. Patient feels improved after treatment with intravenous Reglan. I suspect that this is migraine.  Doug SouSam Lamoine Magallon, MD 04/04/16 [phone]

## 2016-04-06 LAB — URINE CULTURE
Culture: >=100000 — AB
SPECIAL REQUESTS: NORMAL

## 2016-04-07 ENCOUNTER — Telehealth (HOSPITAL_BASED_OUTPATIENT_CLINIC_OR_DEPARTMENT_OTHER): Payer: Self-pay

## 2016-04-07 NOTE — Telephone Encounter (Signed)
Post ED Visit - Positive Culture Follow-up  Culture report reviewed by antimicrobial stewardship pharmacist:  []  Enzo BiNathan Batchelder, Pharm.D. []  Celedonio MiyamotoJeremy Frens, Pharm.D., BCPS []  Garvin FilaMike Maccia, Pharm.D. []  Georgina PillionElizabeth Martin, [address]Ph[address]D., BCPS []  HamptonMinh Pham, [address]Pharm[address]D., BCPS, AAHIVP []  Estella HuskMichelle Turner, Pharm.D., BCPS, AAHIVP [x]  Tennis Mustassie Stewart, Pharm.D. []  Sherle Poeob Vincent, [place]arm[place]D.  Positive urine culture Treated with Cephalexin, organism sensitive to the same and no further patient follow-up is required at this time.  Jerry CarasCullom, Freja Faro Burnett 04/07/2016, 9:32 AM

## 2017-10-05 ENCOUNTER — Emergency Department (HOSPITAL_COMMUNITY)
Admission: EM | Admit: 2017-10-05 | Discharge: 2017-10-05 | Disposition: A | Discharge status: Home / Self Care | Attending: Emergency Medicine | Admitting: Emergency Medicine

## 2017-10-05 ENCOUNTER — Encounter (HOSPITAL_COMMUNITY): Payer: Self-pay | Admitting: Emergency Medicine

## 2017-10-05 ENCOUNTER — Emergency Department (HOSPITAL_COMMUNITY)

## 2017-10-05 DIAGNOSIS — G43909 Migraine, unspecified, not intractable, without status migrainosus: Secondary | ICD-10-CM | POA: Insufficient documentation

## 2017-10-05 DIAGNOSIS — M7918 Myalgia, other site: Secondary | ICD-10-CM

## 2017-10-05 DIAGNOSIS — M549 Dorsalgia, unspecified: Secondary | ICD-10-CM | POA: Diagnosis present

## 2017-10-05 DIAGNOSIS — G43009 Migraine without aura, not intractable, without status migrainosus: Secondary | ICD-10-CM

## 2017-10-05 DIAGNOSIS — M791 Myalgia, unspecified site: Secondary | ICD-10-CM | POA: Diagnosis not present

## 2017-10-05 DIAGNOSIS — Z79899 Other long term (current) drug therapy: Secondary | ICD-10-CM | POA: Diagnosis not present

## 2017-10-05 MED ORDER — METHOCARBAMOL 500 MG PO TABS: 500.0000 mg | ORAL_TABLET | Freq: Two times a day (BID) | ORAL | 0 refills | Status: DC

## 2017-10-05 MED ORDER — METOCLOPRAMIDE HCL 10 MG PO TABS
5.0000 mg | ORAL_TABLET | Freq: Once | ORAL | Status: AC
  Administered 2017-10-05: 5 mg via ORAL
  Filled 2017-10-05: qty 1

## 2017-10-05 MED ORDER — DIPHENHYDRAMINE HCL 25 MG PO CAPS
25.0000 mg | ORAL_CAPSULE | Freq: Once | ORAL | Status: AC
  Administered 2017-10-05: 25 mg via ORAL
  Filled 2017-10-05: qty 1

## 2017-10-05 MED ORDER — KETOROLAC TROMETHAMINE 30 MG/ML IJ SOLN
30.0000 mg | Freq: Once | INTRAMUSCULAR | Status: AC
  Administered 2017-10-05: 30 mg via INTRAMUSCULAR
  Filled 2017-10-05: qty 1
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
## 2017-10-05 NOTE — ED Provider Notes (Signed)
MSE was initiated and I personally evaluated the patient and placed orders (if any) at  7:20 PM on October 05, 2017. Sharon Powell is a 21 y.o. female who presents to the ED with headache, neck and back pain.  Patient states this is the worse headache of her life and "please turn the light out and get me a bed", my neck and head are killing me".  BP 140/81 (BP Location: Left Arm)   Pulse 90   Temp 98.2 F (36.8 C) (Oral)   Resp 14   Ht 5\' 6"  (1.676 m)   Wt 68.9 kg (152 lb)   SpO2 97%   BMI 24.53 kg/m  Patient alert and oriented, appears uncomfortable. The patient appears stable so that the remainder of the MSE may be completed by another provider.   Kerrie Buffaloeese, Hope WalnuttownM, [place]NP 10/05/17 [id number]    Campos, Kevin, MD 10/05/17 [phone]

## 2017-10-05 NOTE — Discharge Instructions (Signed)
Please read attached information. If you experience any new or worsening signs or symptoms please return to the emergency room for evaluation. Please follow-up with your primary care provider or specialist as discussed. Please use medication prescribed only as directed and discontinue taking if you have any concerning signs or symptoms.   °

## 2017-10-05 NOTE — ED Notes (Signed)
ED Provider at bedside. 

## 2017-10-05 NOTE — ED Notes (Signed)
Patient transported to CT 

## 2018-06-05 ENCOUNTER — Ambulatory Visit (HOSPITAL_COMMUNITY)
Admission: EM | Admit: 2018-06-05 | Discharge: 2018-06-05 | Disposition: A | Discharge status: Home / Self Care | Attending: Family Medicine | Admitting: Family Medicine

## 2018-06-05 ENCOUNTER — Encounter (HOSPITAL_COMMUNITY): Payer: Self-pay | Admitting: Emergency Medicine

## 2018-06-05 DIAGNOSIS — Z79899 Other long term (current) drug therapy: Secondary | ICD-10-CM | POA: Insufficient documentation

## 2018-06-05 DIAGNOSIS — J029 Acute pharyngitis, unspecified: Secondary | ICD-10-CM

## 2018-06-05 MED ORDER — TRIAMCINOLONE ACETONIDE 55 MCG/ACT NA AERO: 2.0000 | INHALATION_SPRAY | Freq: Every day | NASAL | 12 refills | Status: DC

## 2018-06-05 MED ORDER — IBUPROFEN 800 MG PO TABS
ORAL_TABLET | ORAL | Status: AC
  Filled 2018-06-05: qty 1

## 2018-06-05 MED ORDER — IBUPROFEN 800 MG PO TABS: 800.0000 mg | ORAL_TABLET | Freq: Three times a day (TID) | ORAL | 0 refills | Status: DC

## 2018-06-05 MED ORDER — IBUPROFEN 800 MG PO TABS
800.0000 mg | ORAL_TABLET | Freq: Once | ORAL | Status: AC
  Administered 2018-06-05: 800 mg via ORAL

## 2018-06-05 NOTE — ED Triage Notes (Signed)
Pt presents with sore throat and pain in both ears

## 2018-06-05 NOTE — ED Provider Notes (Signed)
[facility]    CSN: [id number] Arrival date & time: 06/05/18  1734     History   Chief Complaint Chief Complaint  Patient presents with  . Sore Throat  . Otalgia    both ears    HPI Sharon Powell is a 22 y.o. female.   Sharon Powell presents with complaints of bilateral ear pain, sot, body aches, runny nose and diarrhea which started three days ago. No urinary symptoms. No abdominal pain. no urinary symptoms. States has had approximately 3 weeks of vaginal bleeding as well, she has a nexplanon. No shortness of breath . Tylenol last this morning. No known ill contacts.    ROS per HPI.          Past Medical History:  Diagnosis Date  . Migraines   . Scoliosis     There are no active problems to display for this patient.   Past Surgical History:  Procedure Laterality Date  . CYST REMOVAL LEG      OB History   None      Home Medications    Prior to Admission medications   Medication Sig Start Date End Date Taking? Authorizing Provider  acetaminophen (TYLENOL) 500 MG tablet Take 2,500 mg by mouth daily as needed for headache.     [provider]  etonogestrel (NEXPLANON) 68 MG IMPL implant 1 each by Subdermal route once.    [provider]  ibuprofen (ADVIL,MOTRIN) 800 MG tablet Take 1 tablet (800 mg total) by mouth 3 (three) times daily. 06/05/18   Georgetta Haber, NP  methocarbamol (ROBAXIN) 500 MG tablet Take 1 tablet (500 mg total) by mouth 2 (two) times daily. 10/05/17   Hedges, Tinnie Gens, PA-C  Multiple Vitamin (MULTIVITAMIN WITH MINERALS) TABS tablet Take 1 tablet by mouth daily.    [provider]  rizatriptan (MAXALT) 10 MG tablet Take 10 mg by mouth as needed for migraine. May repeat in 2 hours if needed    [provider]  triamcinolone (NASACORT) 55 MCG/ACT AERO nasal inhaler Place 2 sprays into the nose daily. 06/05/18   Georgetta Haber, NP    Family History History reviewed. No pertinent family  history.  Social History Social History   Tobacco Use  . Smoking status: Never Smoker  . Smokeless tobacco: Never Used  Substance Use Topics  . Alcohol use: Yes    Comment: occasional   . Drug use: No     Allergies   Patient has no known allergies.   Review of Systems Review of Systems   Physical Exam Triage Vital Signs ED Triage Vitals  Enc Vitals Group     BP 06/05/18 1836 123/80     Pulse Rate 06/05/18 1836 97     Resp 06/05/18 1836 20     Temp 06/05/18 1836 (!) 100.6 F (38.1 C)     Temp Source 06/05/18 1836 Temporal     SpO2 06/05/18 1836 100 %     Weight --      Height --      Head Circumference --      Peak Flow --      Pain Score 06/05/18 1835 10     Pain Loc --      Pain Edu? --      Excl. in GC? --    No data found.  Updated Vital Signs BP 123/80 (BP Location: Right Arm)   Pulse 97   Temp (!) 100.6 F (38.1 C) (Temporal)  Resp 20   SpO2 100%    Physical Exam  Constitutional: She is oriented to person, place, and time. She appears well-developed and well-nourished. No distress.  HENT:  Head: Normocephalic and atraumatic.  Right Ear: External ear and ear canal normal. A middle ear effusion is present.  Left Ear: External ear and ear canal normal. A middle ear effusion is present.  Nose: Rhinorrhea present. Right sinus exhibits no maxillary sinus tenderness and no frontal sinus tenderness. Left sinus exhibits no maxillary sinus tenderness and no frontal sinus tenderness.  Mouth/Throat: Uvula is midline, oropharynx is clear and moist and mucous membranes are normal. Tonsils are 1+ on the right. Tonsils are 1+ on the left. No tonsillar exudate.  Eyes: Pupils are equal, round, and reactive to light. Conjunctivae and EOM are normal.  Cardiovascular: Normal rate, regular rhythm and normal heart sounds.  Pulmonary/Chest: Effort normal and breath sounds normal.  Neurological: She is alert and oriented to person, place, and time.  Skin: Skin is warm  and dry.     UC Treatments / Results  Labs (all labs ordered are listed, but only abnormal results are displayed) Labs Reviewed  CULTURE, GROUP A STREP [facility]    EKG None  Radiology No results found.  Procedures Procedures (including critical care time)  Medications Ordered in UC Medications  ibuprofen (ADVIL,MOTRIN) tablet 800 mg (has no administration in time range)    Initial Impression / Assessment and Plan / UC Course  I have reviewed the triage vital signs and the nursing notes.  Pertinent labs & imaging results that were available during my care of the patient were reviewed by me and considered in my medical decision making (see chart for details).     Low grade temp noted. Rapid strep negative. Culture pending. History and physical consistent with viral illness.  Supportive cares recommended. Ibuprofen provided prior to departure. Return precautions provided. Patient verbalized understanding and agreeable to plan.    Final Clinical Impressions(s) / UC Diagnoses   Final diagnoses:  Viral pharyngitis     Discharge Instructions     Push fluids to ensure adequate hydration and keep secretions thin.  Tylenol and/or ibuprofen as needed for pain or fevers.  Nasacort nasal spray daily to help with ear pain as well.  Rest.  Diet as tolerated.  We have sent your throat swab to be cultured and will call if this returns positive.  If symptoms worsen or do not improve in the next week to return to be seen or to follow up with your PCP.     ED Prescriptions    Medication Sig Dispense Auth. Provider   triamcinolone (NASACORT) 55 MCG/ACT AERO nasal inhaler Place 2 sprays into the nose daily. 1 Inhaler Linus MakoBurky, Ruari Duggan B, NP   ibuprofen (ADVIL,MOTRIN) 800 MG tablet Take 1 tablet (800 mg total) by mouth 3 (three) times daily. 21 tablet Georgetta HaberBurky, Delrae Hagey B, NP     Controlled Substance Prescriptions [place] Controlled Substance Registry consulted? Not Applicable   Georgetta HaberBurky,  Tajai Ihde B, NP 06/05/18 1911

## 2018-06-05 NOTE — Discharge Instructions (Signed)
Push fluids to ensure adequate hydration and keep secretions thin.  Tylenol and/or ibuprofen as needed for pain or fevers.  Nasacort nasal spray daily to help with ear pain as well.  Rest.  Diet as tolerated.  We have sent your throat swab to be cultured and will call if this returns positive.  If symptoms worsen or do not improve in the next week to return to be seen or to follow up with your PCP.

## 2018-06-08 LAB — CULTURE, GROUP A STREP (THRC)

## 2018-10-24 ENCOUNTER — Encounter (HOSPITAL_COMMUNITY): Payer: Self-pay

## 2018-10-24 ENCOUNTER — Emergency Department (HOSPITAL_COMMUNITY)
Admission: EM | Admit: 2018-10-24 | Discharge: 2018-10-25 | Disposition: A | Discharge status: Home / Self Care | Payer: Self-pay | Attending: Emergency Medicine | Admitting: Emergency Medicine

## 2018-10-24 DIAGNOSIS — Z113 Encounter for screening for infections with a predominantly sexual mode of transmission: Secondary | ICD-10-CM | POA: Insufficient documentation

## 2018-10-24 DIAGNOSIS — Z79899 Other long term (current) drug therapy: Secondary | ICD-10-CM | POA: Insufficient documentation

## 2018-10-24 DIAGNOSIS — N760 Acute vaginitis: Secondary | ICD-10-CM | POA: Insufficient documentation

## 2018-10-24 DIAGNOSIS — N939 Abnormal uterine and vaginal bleeding, unspecified: Secondary | ICD-10-CM

## 2018-10-24 DIAGNOSIS — B9689 Other specified bacterial agents as the cause of diseases classified elsewhere: Secondary | ICD-10-CM | POA: Insufficient documentation

## 2018-10-24 LAB — URINALYSIS, ROUTINE W REFLEX MICROSCOPIC
BACTERIA UA: NONE SEEN
BILIRUBIN URINE: NEGATIVE
Glucose, UA: NEGATIVE mg/dL
KETONES UR: NEGATIVE mg/dL
LEUKOCYTES UA: NEGATIVE
Nitrite: NEGATIVE
PROTEIN: NEGATIVE mg/dL
Specific Gravity, Urine: 1.014 (ref 1.005–1.030)
pH: 6 (ref 5.0–8.0)

## 2018-10-24 NOTE — ED Triage Notes (Signed)
Pt complains of vaginal bleeding for one month, she also states that she wants tested for STD's

## 2018-10-25 LAB — HIV ANTIBODY (ROUTINE TESTING W REFLEX): HIV Screen 4th Generation wRfx: NONREACTIVE

## 2018-10-25 LAB — CBC
HCT: 33.9 % — ABNORMAL LOW (ref 36.0–46.0)
HEMOGLOBIN: 11.2 g/dL — AB (ref 12.0–15.0)
MCH: 29.9 pg (ref 26.0–34.0)
MCHC: 33 g/dL (ref 30.0–36.0)
MCV: 90.4 fL (ref 80.0–100.0)
NRBC: 0 % (ref 0.0–0.2)
Platelets: 348 10*3/uL (ref 150–400)
RBC: 3.75 MIL/uL — AB (ref 3.87–5.11)
RDW: 12.5 % (ref 11.5–15.5)
WBC: 10.8 10*3/uL — AB (ref 4.0–10.5)

## 2018-10-25 LAB — WET PREP, GENITAL
SPERM: NONE SEEN
Trich, Wet Prep: NONE SEEN
Yeast Wet Prep HPF POC: NONE SEEN

## 2018-10-25 LAB — I-STAT BETA HCG BLOOD, ED (MC, WL, AP ONLY)

## 2018-10-25 LAB — RPR: RPR: NONREACTIVE

## 2018-10-25 LAB — PREGNANCY, URINE: PREG TEST UR: NEGATIVE

## 2018-10-25 MED ORDER — METRONIDAZOLE 0.75 % VA GEL: 1.0000 | Freq: Two times a day (BID) | VAGINAL | 0 refills | Status: DC

## 2018-10-25 NOTE — Discharge Instructions (Addendum)
Please follow up with your gynecologist for further management of abnormal vaginal bleeding.

## 2018-10-25 NOTE — ED Notes (Signed)
PT DISCHARGED. INSTRUCTIONS GIVEN. AAOX4. PT IN NO APPARENT DISTRESS OR PAIN. THE OPPORTUNITY TO ASK QUESTIONS WAS PROVIDED. 

## 2018-10-25 NOTE — ED Provider Notes (Signed)
[facility]-EMERGENCY DEPT Provider Note   CSN: [id number] Arrival date & time: 10/24/18  2008     History   Chief Complaint No chief complaint on file.   HPI Sharon Powell is a 22 y.o. female.  Patient to ED with complaint of vaginal bleeding x 1 month. She states at times it is heavier than a normal period and at times lighter. No lightheadedness, dizziness, SOB, fatigue. No vaginal discharge, abdominal pain or nausea. No history of irregular menses. She is requesting STD testing due to symptoms of persistent vaginal bleeding.  The history is provided by the patient. No language interpreter was used.    Past Medical History:  Diagnosis Date  . Migraines   . Scoliosis     There are no active problems to display for this patient.   Past Surgical History:  Procedure Laterality Date  . CYST REMOVAL LEG       OB History   No obstetric history on file.      Home Medications    Prior to Admission medications   Medication Sig Start Date End Date Taking? Authorizing Provider  acetaminophen (TYLENOL) 500 MG tablet Take 2,500 mg by mouth daily as needed for headache.     [provider]  etonogestrel (NEXPLANON) 68 MG IMPL implant 1 each by Subdermal route once.    [provider]  ibuprofen (ADVIL,MOTRIN) 800 MG tablet Take 1 tablet (800 mg total) by mouth 3 (three) times daily. 06/05/18   Georgetta HaberBurky, Natalie B, NP  methocarbamol (ROBAXIN) 500 MG tablet Take 1 tablet (500 mg total) by mouth 2 (two) times daily. 10/05/17   Hedges, Tinnie GensJeffrey, PA-C  Multiple Vitamin (MULTIVITAMIN WITH MINERALS) TABS tablet Take 1 tablet by mouth daily.    [provider]  rizatriptan (MAXALT) 10 MG tablet Take 10 mg by mouth as needed for migraine. May repeat in 2 hours if needed    [provider]  triamcinolone (NASACORT) 55 MCG/ACT AERO nasal inhaler Place 2 sprays into the nose daily. 06/05/18   Georgetta HaberBurky, Natalie B, NP    Family  History History reviewed. No pertinent family history.  Social History Social History   Tobacco Use  . Smoking status: Never Smoker  . Smokeless tobacco: Never Used  Substance Use Topics  . Alcohol use: Yes    Comment: occasional   . Drug use: No     Allergies   Patient has no known allergies.   Review of Systems Review of Systems  Constitutional: Negative for fatigue and fever.  Respiratory: Negative for shortness of breath.   Gastrointestinal: Negative for abdominal pain and nausea.  Genitourinary: Positive for menstrual problem and vaginal bleeding. Negative for dysuria and vaginal discharge.  Neurological: Negative for dizziness, syncope, weakness and light-headedness.     Physical Exam Updated Vital Signs BP 113/74 (BP Location: Right Arm)   Pulse 80   Temp 98.2 F (36.8 C) (Oral)   Resp 18   Ht 5\' 5"  (1.651 m)   Wt 85.3 kg   LMP 09/15/2018   SpO2 98%   BMI 31.28 kg/m   Physical Exam Constitutional:      Appearance: She is well-developed.  Neck:     Musculoskeletal: Normal range of motion.  Pulmonary:     Effort: Pulmonary effort is normal.  Genitourinary:    General: Normal vulva.     Vagina: No vaginal discharge.     Cervix: No discharge, friability or cervical bleeding.     Uterus:  Normal.      Adnexa: Right adnexa normal and left adnexa normal.       Right: No mass or tenderness.         Left: No mass or tenderness.    Skin:    General: Skin is warm and dry.  Neurological:     Mental Status: She is alert and oriented to person, place, and time.      ED Treatments / Results  Labs (all labs ordered are listed, but only abnormal results are displayed) Labs Reviewed  URINALYSIS, ROUTINE W REFLEX MICROSCOPIC - Abnormal; Notable for the following components:      Result Value   Hgb urine dipstick SMALL (*)    All other components within normal limits  WET PREP, GENITAL  PREGNANCY, URINE  RPR  HIV ANTIBODY (ROUTINE TESTING W REFLEX)   CBC  I-STAT BETA HCG BLOOD, ED ([facility], [facility], [facility] ONLY)  GC/CHLAMYDIA PROBE AMP ([place]) NOT AT [facility]    EKG None  Radiology No results found.  Procedures Procedures (including critical care time)  Medications Ordered in ED Medications - No data to display   Initial Impression / Assessment and Plan / ED Course  I have reviewed the triage vital signs and the nursing notes.  Pertinent labs & imaging results that were available during my care of the patient were reviewed by me and considered in my medical decision making (see chart for details).     Patient to ED with vaginal bleeding x 1 month. No symptoms of anemia, no vaginal discharge. No history of same.   Labs show strong hgb of 11.2. There is evidence of BV but do not suspect other pelvic infection. There is no significant or active bleeding seen on exam.   Will treat BV with Flagyl and encourage her to see her OB/GYN for further outpatient management of abnormal uterine bleeding. Stable for discharge.   Final Clinical Impressions(s) / ED Diagnoses   Final diagnoses:  None   1. bv 2. Irregular vaginal bleeding  ED Discharge Orders    None       Elpidio AnisUpstill, Sharon Hoeger, PA-C 10/25/18 0247    Shaune PollackIsaacs, [place], MD 10/25/18 [phone]

## 2018-10-27 LAB — GC/CHLAMYDIA PROBE AMP (~~LOC~~) NOT AT ARMC
CHLAMYDIA, DNA PROBE: NEGATIVE
Neisseria Gonorrhea: NEGATIVE

## 2020-02-10 ENCOUNTER — Encounter (HOSPITAL_COMMUNITY): Payer: Self-pay | Admitting: Emergency Medicine

## 2020-02-10 ENCOUNTER — Encounter: Payer: Self-pay | Admitting: Student

## 2020-02-10 ENCOUNTER — Other Ambulatory Visit: Payer: Self-pay

## 2020-02-10 ENCOUNTER — Emergency Department (HOSPITAL_COMMUNITY)
Admission: EM | Admit: 2020-02-10 | Discharge: 2020-02-10 | Disposition: A | Discharge status: Home / Self Care | Payer: Medicaid Other | Attending: Emergency Medicine | Admitting: Emergency Medicine

## 2020-02-10 DIAGNOSIS — J029 Acute pharyngitis, unspecified: Secondary | ICD-10-CM | POA: Insufficient documentation

## 2020-02-10 DIAGNOSIS — Z20822 Contact with and (suspected) exposure to covid-19: Secondary | ICD-10-CM | POA: Insufficient documentation

## 2020-02-10 DIAGNOSIS — Z79899 Other long term (current) drug therapy: Secondary | ICD-10-CM | POA: Insufficient documentation

## 2020-02-10 MED ORDER — LIDOCAINE VISCOUS HCL 2 % MT SOLN
15.0000 mL | Freq: Once | OROMUCOSAL | Status: AC
  Administered 2020-02-10: 15 mL via OROMUCOSAL
  Filled 2020-02-10: qty 15

## 2020-02-10 MED ORDER — FLUTICASONE PROPIONATE 50 MCG/ACT NA SUSP: 1.0000 | Freq: Every day | NASAL | 0 refills | Status: DC

## 2020-02-10 MED ORDER — PHENOL 1.4 % MT LIQD: 1.0000 | OROMUCOSAL | 0 refills | Status: DC | PRN

## 2020-02-10 MED ORDER — BENZONATATE 100 MG PO CAPS: 100.0000 mg | ORAL_CAPSULE | Freq: Three times a day (TID) | ORAL | 0 refills | Status: DC

## 2020-02-10 NOTE — ED Triage Notes (Signed)
Pt reports sore throat since yesterday. Denies sick contacts.

## 2020-02-10 NOTE — ED Provider Notes (Signed)
[facility] EMERGENCY DEPARTMENT Provider Note   CSN: [id number] Arrival date & time: 02/10/20  1244     History Chief Complaint  Patient presents with  . Sore Throat    Sharon Powell is a 24 y.o. female presenting for evaluation of sore throat, cough, congestion.  Patient states her symptoms began yesterday.  She reports a mild nonproductive cough, sore throat, nasal congestion.  She denies fevers, chills, chest pain, shortness breath, nausea, vomiting abdominal pain.  She not taking anything for her symptoms.  She denies sick contacts.  She denies contact with known COVID-19 positive person.  She reports no other medical problems, takes no medications daily.  HPI     Past Medical History:  Diagnosis Date  . Migraines   . Scoliosis     There are no problems to display for this patient.   Past Surgical History:  Procedure Laterality Date  . CYST REMOVAL LEG       OB History   No obstetric history on file.     No family history on file.  Social History   Tobacco Use  . Smoking status: Never Smoker  . Smokeless tobacco: Never Used  Substance Use Topics  . Alcohol use: Yes    Comment: occasional   . Drug use: No    Home Medications Prior to Admission medications   Medication Sig Start Date End Date Taking? Authorizing Provider  etonogestrel (NEXPLANON) 68 MG IMPL implant 1 each by Subdermal route once.   Yes [provider]  rizatriptan (MAXALT) 10 MG tablet Take 10 mg by mouth as needed for migraine. May repeat in 2 hours if needed   Yes [provider]  benzonatate (TESSALON) 100 MG capsule Take 1 capsule (100 mg total) by mouth every 8 (eight) hours. 02/10/20   Tavius Turgeon, PA-C  fluticasone (FLONASE) 50 MCG/ACT nasal spray Place 1 spray into both nostrils daily. 02/10/20   Caniya Tagle, PA-C  phenol (CHLORASEPTIC) 1.4 % LIQD Use as directed 1 spray in the mouth or throat as needed for throat irritation / pain.  02/10/20   Aryaa Bunting, PA-C    Allergies    Patient has no known allergies.  Review of Systems   Review of Systems  HENT: Positive for congestion and sore throat.   Respiratory: Positive for cough.     Physical Exam Updated Vital Signs BP (!) 155/79 (BP Location: Right Arm)   Pulse 79   Temp 99 F (37.2 C) (Oral)   Resp 12   Ht 5\' 6"  (1.676 m)   Wt 79.4 kg   SpO2 100%   BMI 28.25 kg/m   Physical Exam Vitals and nursing note reviewed.  Constitutional:      General: She is not in acute distress.    Appearance: She is well-developed.     Comments: Appears nontoxic  HENT:     Head: Normocephalic and atraumatic.     Right Ear: Tympanic membrane, ear canal and external ear normal.     Left Ear: Tympanic membrane, ear canal and external ear normal.     Nose: Mucosal edema present.     Right Sinus: No maxillary sinus tenderness or frontal sinus tenderness.     Left Sinus: No maxillary sinus tenderness or frontal sinus tenderness.     Mouth/Throat:     Pharynx: Uvula midline. Posterior oropharyngeal erythema present.     Tonsils: No tonsillar exudate.     Comments: OP erythematous with mild bilateral tonsillar  swelling.  No obvious exudate.  Uvula midline with equal palate rise.  No trismus or muffled voice.  Handling secretions easily Eyes:     Conjunctiva/sclera: Conjunctivae normal.     Pupils: Pupils are equal, round, and reactive to light.  Cardiovascular:     Rate and Rhythm: Normal rate and regular rhythm.     Pulses: Normal pulses.  Pulmonary:     Effort: Pulmonary effort is normal.     Breath sounds: Normal breath sounds. No decreased breath sounds, wheezing, rhonchi or rales.     Comments: Clear lung sounds on exam. Speaking in full sentences. Intermittent cough noted during exam Abdominal:     General: There is no distension.     Palpations: Abdomen is soft. There is no mass.     Tenderness: There is no abdominal tenderness. There is no guarding or  rebound.  Musculoskeletal:        General: Normal range of motion.     Cervical back: Normal range of motion.  Lymphadenopathy:     Cervical: No cervical adenopathy.  Skin:    General: Skin is warm.     Capillary Refill: Capillary refill takes less than 2 seconds.  Neurological:     Mental Status: She is alert and oriented to person, place, and time.     ED Results / Procedures / Treatments   Labs (all labs ordered are listed, but only abnormal results are displayed) Labs Reviewed  SARS CORONAVIRUS 2 (TAT 6-24 HRS)    EKG None  Radiology No results found.  Procedures Procedures (including critical care time)  Medications Ordered in ED Medications  lidocaine (XYLOCAINE) 2 % viscous mouth solution 15 mL (has no administration in time range)    ED Course  I have reviewed the triage vital signs and the nursing notes.  Pertinent labs & imaging results that were available during my care of the patient were reviewed by me and considered in my medical decision making (see chart for details).    MDM Rules/Calculators/A&P                      Patient presenting with URI symptoms.  Physical exam reassuring, patient is afebrile and appears nontoxic.  Pulmonary exam reassuring.  Doubt pneumonia, strep, other bacterial infection, or peritonsillar abscess. Centor score of 1, doubt strep. Offered test, but pt did not want to wait. Likely viral.  Will treat symptomatically.  Patient to follow-up with primary care as needed.  At this time, patient appears safe for discharge.  Return precautions given.  Patient states she understands and agrees to plan.  Final Clinical Impression(s) / ED Diagnoses Final diagnoses:  Pharyngitis, unspecified etiology    Rx / DC Orders ED Discharge Orders         Ordered    phenol (CHLORASEPTIC) 1.4 % LIQD  As needed     02/10/20 1441    benzonatate (TESSALON) 100 MG capsule  Every 8 hours     02/10/20 1441    fluticasone (FLONASE) 50 MCG/ACT  nasal spray  Daily     02/10/20 1441           Loris Winrow, PA-C 02/10/20 1454    Virgel Manifold, MD 02/11/20 [phone]

## 2020-02-10 NOTE — ED Notes (Signed)
Patient verbalizes understanding of discharge instructions. Opportunity for questioning and answers were provided. Armband removed by staff, pt discharged from ED.  

## 2020-02-10 NOTE — Discharge Instructions (Signed)
You likely have a viral illness.  This should be treated symptomatically. Use Tylenol or ibuprofen as needed for fevers or body aches. Use Flonase daily for nasal congestion and cough. Use Tessalon Perles as Use sore throat spray as needed. Make sure you stay well-hydrated with water. Wash your hands frequently to prevent spread of infection. Follow-up with your primary care doctor in 1 week if your symptoms are not improving. Return to the emergency room if you develop chest pain, difficulty breathing, or any new or worsening symptoms.

## 2020-02-11 LAB — SARS CORONAVIRUS 2 (TAT 6-24 HRS): SARS Coronavirus 2: NEGATIVE

## 2021-02-15 ENCOUNTER — Emergency Department (HOSPITAL_COMMUNITY): Payer: 59

## 2021-02-15 ENCOUNTER — Other Ambulatory Visit: Payer: Self-pay

## 2021-02-15 ENCOUNTER — Emergency Department (HOSPITAL_COMMUNITY)
Admission: EM | Admit: 2021-02-15 | Discharge: 2021-02-16 | Disposition: A | Discharge status: Home / Self Care | Payer: 59 | Attending: Emergency Medicine | Admitting: Emergency Medicine

## 2021-02-15 ENCOUNTER — Encounter (HOSPITAL_COMMUNITY): Payer: Self-pay

## 2021-02-15 DIAGNOSIS — M79601 Pain in right arm: Secondary | ICD-10-CM | POA: Insufficient documentation

## 2021-02-15 DIAGNOSIS — Z7982 Long term (current) use of aspirin: Secondary | ICD-10-CM | POA: Diagnosis not present

## 2021-02-15 DIAGNOSIS — Y92410 Unspecified street and highway as the place of occurrence of the external cause: Secondary | ICD-10-CM | POA: Diagnosis not present

## 2021-02-15 DIAGNOSIS — R519 Headache, unspecified: Secondary | ICD-10-CM | POA: Insufficient documentation

## 2021-02-15 DIAGNOSIS — M25561 Pain in right knee: Secondary | ICD-10-CM | POA: Insufficient documentation

## 2021-02-15 DIAGNOSIS — M542 Cervicalgia: Secondary | ICD-10-CM | POA: Insufficient documentation

## 2021-02-15 LAB — HEPATIC FUNCTION PANEL
ALT: 21 U/L (ref 0–44)
AST: 21 U/L (ref 15–41)
Albumin: 4.3 g/dL (ref 3.5–5.0)
Alkaline Phosphatase: 45 U/L (ref 38–126)
Bilirubin, Direct: <0.1 mg/dL (ref 0.0–0.2)
Total Bilirubin: 0.4 mg/dL (ref 0.3–1.2)
Total Protein: 7.4 g/dL (ref 6.5–8.1)

## 2021-02-15 LAB — CBC WITH DIFFERENTIAL/PLATELET
Abs Immature Granulocytes: 0.02 10*3/uL (ref 0.00–0.07)
Basophils Absolute: 0.1 10*3/uL (ref 0.0–0.1)
Basophils Relative: 1 %
Eosinophils Absolute: 0.1 10*3/uL (ref 0.0–0.5)
Eosinophils Relative: 1 %
HCT: 33.7 % — ABNORMAL LOW (ref 36.0–46.0)
Hemoglobin: 11.2 g/dL — ABNORMAL LOW (ref 12.0–15.0)
Immature Granulocytes: 0 %
Lymphocytes Relative: 23 %
Lymphs Abs: 2.3 10*3/uL (ref 0.7–4.0)
MCH: 29.5 pg (ref 26.0–34.0)
MCHC: 33.2 g/dL (ref 30.0–36.0)
MCV: 88.7 fL (ref 80.0–100.0)
Monocytes Absolute: 0.6 10*3/uL (ref 0.1–1.0)
Monocytes Relative: 6 %
Neutro Abs: 7.1 10*3/uL (ref 1.7–7.7)
Neutrophils Relative %: 69 %
Platelets: 274 10*3/uL (ref 150–400)
RBC: 3.8 MIL/uL — ABNORMAL LOW (ref 3.87–5.11)
RDW: 12.8 % (ref 11.5–15.5)
WBC: 10.1 10*3/uL (ref 4.0–10.5)
nRBC: 0 % (ref 0.0–0.2)

## 2021-02-15 LAB — URINALYSIS, ROUTINE W REFLEX MICROSCOPIC
Bilirubin Urine: NEGATIVE
Glucose, UA: NEGATIVE mg/dL
Ketones, ur: NEGATIVE mg/dL
Leukocytes,Ua: NEGATIVE
Nitrite: NEGATIVE
Protein, ur: 30 mg/dL — AB
Specific Gravity, Urine: 1.013 (ref 1.005–1.030)
pH: 6 (ref 5.0–8.0)

## 2021-02-15 LAB — BASIC METABOLIC PANEL
Anion gap: 6 (ref 5–15)
BUN: 10 mg/dL (ref 6–20)
CO2: 26 mmol/L (ref 22–32)
Calcium: 9.4 mg/dL (ref 8.9–10.3)
Chloride: 108 mmol/L (ref 98–111)
Creatinine, Ser: 0.7 mg/dL (ref 0.44–1.00)
GFR, Estimated: >60 mL/min (ref >60)
Glucose, Bld: 104 mg/dL — ABNORMAL HIGH (ref 70–99)
Potassium: 3.8 mmol/L (ref 3.5–5.1)
Sodium: 140 mmol/L (ref 135–145)

## 2021-02-15 LAB — I-STAT BETA HCG BLOOD, ED (MC, WL, AP ONLY): I-stat hCG, quantitative: <5 m[IU]/mL (ref <5)

## 2021-02-15 MED ORDER — ONDANSETRON HCL 4 MG/2ML IJ SOLN
4.0000 mg | Freq: Once | INTRAMUSCULAR | Status: AC
  Administered 2021-02-15: 4 mg via INTRAVENOUS
  Filled 2021-02-15: qty 2

## 2021-02-15 MED ORDER — MORPHINE SULFATE (PF) 4 MG/ML IV SOLN
4.0000 mg | Freq: Once | INTRAVENOUS | Status: AC
  Administered 2021-02-15: 4 mg via INTRAVENOUS
  Filled 2021-02-15: qty 1

## 2021-02-15 MED ORDER — SODIUM CHLORIDE 0.9 % IV BOLUS
1000.0000 mL | Freq: Once | INTRAVENOUS | Status: AC
  Administered 2021-02-15: 1000 mL via INTRAVENOUS

## 2021-02-15 MED ORDER — IOHEXOL 300 MG/ML  SOLN
75.0000 mL | Freq: Once | INTRAMUSCULAR | Status: AC | PRN
  Administered 2021-02-15: 75 mL via INTRAVENOUS
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
## 2021-04-14 DIAGNOSIS — G43909 Migraine, unspecified, not intractable, without status migrainosus: Secondary | ICD-10-CM | POA: Insufficient documentation

## 2021-04-14 DIAGNOSIS — D649 Anemia, unspecified: Secondary | ICD-10-CM | POA: Insufficient documentation

## 2021-04-17 DIAGNOSIS — E559 Vitamin D deficiency, unspecified: Secondary | ICD-10-CM | POA: Insufficient documentation

## 2021-06-20 ENCOUNTER — Other Ambulatory Visit: Payer: Self-pay

## 2021-06-20 ENCOUNTER — Emergency Department (HOSPITAL_COMMUNITY)
Admission: EM | Admit: 2021-06-20 | Discharge: 2021-06-20 | Disposition: A | Discharge status: Home / Self Care | Payer: 59 | Attending: Emergency Medicine | Admitting: Emergency Medicine

## 2021-06-20 ENCOUNTER — Emergency Department (HOSPITAL_COMMUNITY): Admission: EM | Admit: 2021-06-20 | Discharge: 2021-06-20 | Disposition: A | Discharge status: Home Health | Payer: 59

## 2021-06-20 DIAGNOSIS — Z5321 Procedure and treatment not carried out due to patient leaving prior to being seen by health care provider: Secondary | ICD-10-CM | POA: Insufficient documentation

## 2021-06-20 DIAGNOSIS — M25561 Pain in right knee: Secondary | ICD-10-CM | POA: Insufficient documentation
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
## 2021-07-02 ENCOUNTER — Encounter (HOSPITAL_COMMUNITY): Payer: Self-pay | Admitting: *Deleted

## 2021-07-02 ENCOUNTER — Other Ambulatory Visit: Payer: Self-pay

## 2021-07-02 ENCOUNTER — Emergency Department (HOSPITAL_COMMUNITY)
Admission: EM | Admit: 2021-07-02 | Discharge: 2021-07-02 | Disposition: A | Discharge status: Home / Self Care | Payer: 59 | Attending: Emergency Medicine | Admitting: Emergency Medicine

## 2021-07-02 DIAGNOSIS — X19XXXA Contact with other heat and hot substances, initial encounter: Secondary | ICD-10-CM | POA: Diagnosis not present

## 2021-07-02 DIAGNOSIS — Z7982 Long term (current) use of aspirin: Secondary | ICD-10-CM | POA: Insufficient documentation

## 2021-07-02 DIAGNOSIS — T25122A Burn of first degree of left foot, initial encounter: Secondary | ICD-10-CM | POA: Diagnosis not present

## 2021-07-02 DIAGNOSIS — T25121A Burn of first degree of right foot, initial encounter: Secondary | ICD-10-CM | POA: Diagnosis not present

## 2021-07-02 DIAGNOSIS — S99921A Unspecified injury of right foot, initial encounter: Secondary | ICD-10-CM | POA: Diagnosis present

## 2021-07-02 DIAGNOSIS — Y9302 Activity, running: Secondary | ICD-10-CM | POA: Insufficient documentation

## 2021-07-02 DIAGNOSIS — T300 Burn of unspecified body region, unspecified degree: Secondary | ICD-10-CM

## 2021-07-02 MED ORDER — BACITRACIN ZINC 500 UNIT/GM EX OINT: 1.0000 "application " | TOPICAL_OINTMENT | Freq: Two times a day (BID) | CUTANEOUS | 0 refills | Status: DC

## 2021-07-02 NOTE — Discharge Instructions (Addendum)
You have been prescribed Bacitracin ointment. Please apply this to both feet to prevent infection A phone number for an orthopedic dr is provided in discharge paperwork.
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
## 2021-07-02 NOTE — ED Notes (Signed)
Patient given discharge instructions, all questions answered. Patient in possession of all belongings, directed to the discharge area  

## 2021-07-10 ENCOUNTER — Other Ambulatory Visit (HOSPITAL_BASED_OUTPATIENT_CLINIC_OR_DEPARTMENT_OTHER): Payer: Self-pay | Admitting: Orthopaedic Surgery

## 2021-07-10 DIAGNOSIS — M25561 Pain in right knee: Secondary | ICD-10-CM

## 2021-07-11 ENCOUNTER — Encounter (HOSPITAL_BASED_OUTPATIENT_CLINIC_OR_DEPARTMENT_OTHER): Payer: Self-pay | Admitting: Orthopaedic Surgery

## 2021-07-11 ENCOUNTER — Ambulatory Visit (HOSPITAL_BASED_OUTPATIENT_CLINIC_OR_DEPARTMENT_OTHER)
Admission: RE | Admit: 2021-07-11 | Discharge: 2021-07-11 | Disposition: A | Discharge status: Home / Self Care | Payer: 59 | Source: Ambulatory Visit | Attending: Orthopaedic Surgery | Admitting: Orthopaedic Surgery

## 2021-07-11 ENCOUNTER — Other Ambulatory Visit: Payer: Self-pay

## 2021-07-11 ENCOUNTER — Telehealth (HOSPITAL_BASED_OUTPATIENT_CLINIC_OR_DEPARTMENT_OTHER): Payer: Self-pay | Admitting: Orthopaedic Surgery

## 2021-07-11 ENCOUNTER — Ambulatory Visit (INDEPENDENT_AMBULATORY_CARE_PROVIDER_SITE_OTHER): Payer: 59 | Admitting: Orthopaedic Surgery

## 2021-07-11 VITALS — BP 114/78 | Ht 65.0 in | Wt 164.0 lb

## 2021-07-11 DIAGNOSIS — S83001A Unspecified subluxation of right patella, initial encounter: Secondary | ICD-10-CM | POA: Diagnosis not present

## 2021-07-11 DIAGNOSIS — M25561 Pain in right knee: Secondary | ICD-10-CM
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
## 2021-07-11 NOTE — Telephone Encounter (Signed)
LMOM for work note that was requested in clinic visit, 07/11/2021

## 2021-07-25 ENCOUNTER — Ambulatory Visit (HOSPITAL_BASED_OUTPATIENT_CLINIC_OR_DEPARTMENT_OTHER): Payer: 59 | Admitting: Orthopaedic Surgery

## 2021-08-01 ENCOUNTER — Ambulatory Visit
Admission: RE | Admit: 2021-08-01 | Discharge: 2021-08-01 | Disposition: A | Discharge status: Home / Self Care | Payer: 59 | Source: Ambulatory Visit | Attending: Orthopaedic Surgery | Admitting: Orthopaedic Surgery

## 2021-08-01 ENCOUNTER — Other Ambulatory Visit: Payer: Self-pay

## 2021-08-01 DIAGNOSIS — M25561 Pain in right knee: Secondary | ICD-10-CM

## 2021-08-15 ENCOUNTER — Other Ambulatory Visit (HOSPITAL_BASED_OUTPATIENT_CLINIC_OR_DEPARTMENT_OTHER): Payer: Self-pay

## 2021-08-15 ENCOUNTER — Ambulatory Visit (HOSPITAL_BASED_OUTPATIENT_CLINIC_OR_DEPARTMENT_OTHER): Payer: 59 | Admitting: Orthopaedic Surgery

## 2021-08-15 ENCOUNTER — Ambulatory Visit (INDEPENDENT_AMBULATORY_CARE_PROVIDER_SITE_OTHER): Payer: 59 | Admitting: Orthopaedic Surgery

## 2021-08-15 ENCOUNTER — Other Ambulatory Visit: Payer: Self-pay

## 2021-08-15 VITALS — Ht 65.0 in | Wt 164.0 lb

## 2021-08-15 DIAGNOSIS — M25561 Pain in right knee: Secondary | ICD-10-CM

## 2021-08-15 MED ORDER — OXYCODONE HCL 5 MG PO TABS
5.0000 mg | ORAL_TABLET | ORAL | 0 refills | Status: DC | PRN
  Filled 2021-08-15: qty 20, 4d supply, fill #0

## 2021-08-15 MED ORDER — IBUPROFEN 800 MG PO TABS
800.0000 mg | ORAL_TABLET | Freq: Three times a day (TID) | ORAL | 0 refills | Status: AC
  Filled 2021-08-15: qty 30, 10d supply, fill #0

## 2021-08-15 MED ORDER — ASPIRIN EC 325 MG PO TBEC
325.0000 mg | DELAYED_RELEASE_TABLET | Freq: Every day | ORAL | 0 refills | Status: DC
  Filled 2021-08-15: qty 30, 30d supply, fill #0

## 2021-08-15 MED ORDER — ACETAMINOPHEN 500 MG PO TABS
500.0000 mg | ORAL_TABLET | Freq: Three times a day (TID) | ORAL | 0 refills | Status: AC
  Filled 2021-08-15: qty 30, 10d supply, fill #0
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
## 2021-08-28 ENCOUNTER — Ambulatory Visit (HOSPITAL_BASED_OUTPATIENT_CLINIC_OR_DEPARTMENT_OTHER): Payer: Self-pay | Admitting: Orthopaedic Surgery

## 2021-08-28 DIAGNOSIS — S83001A Unspecified subluxation of right patella, initial encounter: Secondary | ICD-10-CM

## 2021-08-28 NOTE — Pre-Procedure Instructions (Addendum)
Surgical Instructions    Your procedure is scheduled on Thursday 08/31/21.   Report to [facility] Main Entrance "A" at 08:30 A.M., then check in with the Admitting office.  Call this number if you have problems the morning of surgery:  [phone]   If you have any questions prior to your surgery date call [phone]: Open Monday-Friday 8am-4pm    Remember:  Do not eat after midnight the night before your surgery  You may drink clear liquids until 07:30 A.M. the morning of your surgery.   Clear liquids allowed are: Water, Non-Citrus Juices (without pulp), Carbonated Beverages, Clear Tea, Black Coffee ONLY (NO MILK, CREAM OR POWDERED CREAMER of any kind), and Gatorade   Enhanced Recovery after Surgery for Orthopedics Enhanced Recovery after Surgery is a protocol used to improve the stress on your body and your recovery after surgery.  Patient Instructions  The day of surgery (if you do NOT have diabetes):  Drink ONE (1) Pre-Surgery Clear Ensure by 07:30 A.M. the morning of surgery   This drink was given to you during your hospital  pre-op appointment visit. Nothing else to drink after completing the  Pre-Surgery Clear Ensure.        If you have questions, please contact your surgeon's office.     Take these medicines the morning of surgery with A SIP OF WATER   acetaminophen (TYLENOL)- If needed  fluticasone [facility])- If needed  oxyCODONE (OXY IR/ROXICODONE)- If needed  As of today, STOP taking any Aspirin (unless otherwise instructed by your surgeon) Aleve, Naproxen, Ibuprofen, Motrin, Advil, Goody's, BC's, all herbal medications, fish oil, and all vitamins.     After your COVID test   You are not required to quarantine however you are required to wear a well-fitting mask when you are out and around people not in your household.  If your mask becomes wet or soiled, replace with a new one.  Wash your hands often with soap and water for 20 seconds or clean your hands  with an alcohol-based hand sanitizer that contains at least 60% alcohol.  Do not share personal items.  Notify your provider: if you are in close contact with someone who has COVID  or if you develop a fever of 100.4 or greater, sneezing, cough, sore throat, shortness of breath or body aches.             Do not wear jewelry or makeup Do not wear lotions, powders, perfumes/colognes, or deodorant. Do not shave 48 hours prior to surgery.  Men may shave face and neck. Do not bring valuables to the hospital. DO Not wear nail polish, gel polish, artificial nails, or any other type of covering on natural nails including finger and toenails. If patients have artificial nails, gel coating, etc. that need to be removed by a nail salon, please have this removed prior to surgery or surgery may need to be canceled/delayed if the surgeon/ anesthesia feels like the patient is unable to be adequately monitored.             [facility] is not responsible for any belongings or valuables.  Do NOT Smoke (Tobacco/Vaping)  24 hours prior to your procedure  If you use a CPAP at night, you may bring your mask for your overnight stay.   Contacts, glasses, hearing aids, dentures or partials may not be worn into surgery, please bring cases for these belongings   For patients admitted to the hospital, discharge time will be determined by  your treatment team.   Patients discharged the day of surgery will not be allowed to drive home, and someone needs to stay with them for 24 hours.  NO VISITORS WILL BE ALLOWED IN PRE-OP WHERE PATIENTS ARE PREPPED FOR SURGERY.  ONLY 1 SUPPORT PERSON MAY BE PRESENT IN THE WAITING ROOM WHILE YOU ARE IN SURGERY.  IF YOU ARE TO BE ADMITTED, ONCE YOU ARE IN YOUR ROOM YOU WILL BE ALLOWED TWO (2) VISITORS. 1 (ONE) VISITOR MAY STAY OVERNIGHT BUT MUST ARRIVE TO THE ROOM BY 8pm.  Minor children may have two parents present. Special consideration for safety and communication needs will be  reviewed on a case by case basis.  Special instructions:    Oral Hygiene is also important to reduce your risk of infection.  Remember - BRUSH YOUR TEETH THE MORNING OF SURGERY WITH YOUR REGULAR TOOTHPASTE   [facility]- Preparing For Surgery  Before surgery, you can play an important role. Because skin is not sterile, your skin needs to be as free of germs as possible. You can reduce the number of germs on your skin by washing with CHG (chlorahexidine gluconate) Soap before surgery.  CHG is an antiseptic cleaner which kills germs and bonds with the skin to continue killing germs even after washing.     Please do not use if you have an allergy to CHG or antibacterial soaps. If your skin becomes reddened/irritated stop using the CHG.  Do not shave (including legs and underarms) for at least 48 hours prior to first CHG shower. It is OK to shave your face.  Please follow these instructions carefully.     Shower the NIGHT BEFORE SURGERY and the MORNING OF SURGERY with CHG Soap.   If you chose to wash your hair, wash your hair first as usual with your normal shampoo. After you shampoo, rinse your hair and body thoroughly to remove the shampoo.  Then [employer] and genitals (private parts) with your normal soap and rinse thoroughly to remove soap.  After that Use CHG Soap as you would any other liquid soap. You can apply CHG directly to the skin and wash gently with a scrungie or a clean washcloth.   Apply the CHG Soap to your body ONLY FROM THE NECK DOWN.  Do not use on open wounds or open sores. Avoid contact with your eyes, ears, mouth and genitals (private parts). Wash Face and genitals (private parts)  with your normal soap.   Wash thoroughly, paying special attention to the area where your surgery will be performed.  Thoroughly rinse your body with warm water from the neck down.  DO NOT shower/wash with your normal soap after using and rinsing off the CHG Soap.  Pat yourself dry with a  CLEAN TOWEL.  Wear CLEAN PAJAMAS to bed the night before surgery  Place CLEAN SHEETS on your bed the night before your surgery  DO NOT SLEEP WITH PETS.   Day of Surgery:  Take a shower with CHG soap. Wear Clean/Comfortable clothing the morning of surgery Do not apply any deodorants/lotions.   Remember to brush your teeth WITH YOUR REGULAR TOOTHPASTE.   Please read over the following fact sheets that you were given.

## 2021-08-29 ENCOUNTER — Encounter (HOSPITAL_COMMUNITY): Payer: Self-pay

## 2021-08-29 ENCOUNTER — Encounter (HOSPITAL_COMMUNITY)
Admission: RE | Admit: 2021-08-29 | Discharge: 2021-08-29 | Disposition: A | Discharge status: Home / Self Care | Payer: 59 | Source: Ambulatory Visit | Attending: Orthopaedic Surgery | Admitting: Orthopaedic Surgery

## 2021-08-29 ENCOUNTER — Other Ambulatory Visit: Payer: Self-pay

## 2021-08-29 VITALS — BP 114/76 | HR 96 | Temp 98.4°F | Resp 17 | Ht 65.0 in | Wt 165.5 lb

## 2021-08-29 DIAGNOSIS — Z01818 Encounter for other preprocedural examination: Secondary | ICD-10-CM

## 2021-08-29 DIAGNOSIS — Z01812 Encounter for preprocedural laboratory examination: Secondary | ICD-10-CM | POA: Insufficient documentation

## 2021-08-29 DIAGNOSIS — Z20822 Contact with and (suspected) exposure to covid-19: Secondary | ICD-10-CM | POA: Diagnosis not present

## 2021-08-29 HISTORY — DX: Anemia, unspecified: D64.9

## 2021-08-29 LAB — CBC
HCT: 34.8 % — ABNORMAL LOW (ref 36.0–46.0)
Hemoglobin: 11.6 g/dL — ABNORMAL LOW (ref 12.0–15.0)
MCH: 28.8 pg (ref 26.0–34.0)
MCHC: 33.3 g/dL (ref 30.0–36.0)
MCV: 86.4 fL (ref 80.0–100.0)
Platelets: 291 10*3/uL (ref 150–400)
RBC: 4.03 MIL/uL (ref 3.87–5.11)
RDW: 13.1 % (ref 11.5–15.5)
WBC: 9.1 10*3/uL (ref 4.0–10.5)
nRBC: 0 % (ref 0.0–0.2)

## 2021-08-29 LAB — SARS CORONAVIRUS 2 (TAT 6-24 HRS): SARS Coronavirus 2: NEGATIVE

## 2021-08-29 NOTE — Progress Notes (Signed)
PCP - Patient denies having a PCP Cardiologist - denies  PPM/ICD - n/a Device Orders - n/a Rep Notified - n/a  Chest x-ray - n/a EKG - n/a Stress Test - denies ECHO - denies Cardiac Cath - denies  Sleep Study - denies CPAP - n/a  Fasting Blood Sugar - n/a  Blood Thinner Instructions: n/a Aspirin Instructions: n/a  ERAS Protcol - Yes PRE-SURGERY Ensure or G2- Ensure  COVID TEST- 08/29/21. Pending.    Anesthesia review: No  Patient denies shortness of breath, fever, cough and chest pain at PAT appointment   All instructions explained to the patient, with a verbal understanding of the material. Patient agrees to go over the instructions while at home for a better understanding. Patient also instructed to self quarantine after being tested for COVID-19. The opportunity to ask questions was provided.

## 2021-08-31 ENCOUNTER — Ambulatory Visit (HOSPITAL_COMMUNITY): Payer: 59 | Admitting: Anesthesiology

## 2021-08-31 ENCOUNTER — Observation Stay (HOSPITAL_COMMUNITY)
Admission: RE | Admit: 2021-08-31 | Discharge: 2021-09-01 | Disposition: A | Discharge status: Home / Self Care | Payer: 59 | Attending: Orthopaedic Surgery | Admitting: Orthopaedic Surgery

## 2021-08-31 ENCOUNTER — Other Ambulatory Visit: Payer: Self-pay

## 2021-08-31 ENCOUNTER — Encounter (HOSPITAL_COMMUNITY)
Admission: RE | Disposition: A | Discharge status: Home / Self Care | Payer: Self-pay | Source: Home / Self Care | Attending: Orthopaedic Surgery

## 2021-08-31 ENCOUNTER — Other Ambulatory Visit (HOSPITAL_BASED_OUTPATIENT_CLINIC_OR_DEPARTMENT_OTHER): Payer: Self-pay | Admitting: Orthopaedic Surgery

## 2021-08-31 ENCOUNTER — Ambulatory Visit (HOSPITAL_COMMUNITY): Payer: 59

## 2021-08-31 ENCOUNTER — Encounter (HOSPITAL_COMMUNITY): Payer: Self-pay | Admitting: Orthopaedic Surgery

## 2021-08-31 DIAGNOSIS — X58XXXA Exposure to other specified factors, initial encounter: Secondary | ICD-10-CM | POA: Insufficient documentation

## 2021-08-31 DIAGNOSIS — Z79899 Other long term (current) drug therapy: Secondary | ICD-10-CM | POA: Insufficient documentation

## 2021-08-31 DIAGNOSIS — S83281A Other tear of lateral meniscus, current injury, right knee, initial encounter: Secondary | ICD-10-CM

## 2021-08-31 DIAGNOSIS — Z7982 Long term (current) use of aspirin: Secondary | ICD-10-CM | POA: Diagnosis not present

## 2021-08-31 DIAGNOSIS — S83282A Other tear of lateral meniscus, current injury, left knee, initial encounter: Secondary | ICD-10-CM | POA: Diagnosis not present

## 2021-08-31 DIAGNOSIS — M222X1 Patellofemoral disorders, right knee: Secondary | ICD-10-CM | POA: Insufficient documentation

## 2021-08-31 DIAGNOSIS — S83011A Lateral subluxation of right patella, initial encounter: Secondary | ICD-10-CM | POA: Diagnosis not present

## 2021-08-31 DIAGNOSIS — S83001A Unspecified subluxation of right patella, initial encounter: Secondary | ICD-10-CM | POA: Diagnosis present

## 2021-08-31 DIAGNOSIS — M25361 Other instability, right knee: Secondary | ICD-10-CM | POA: Diagnosis present

## 2021-08-31 DIAGNOSIS — Z419 Encounter for procedure for purposes other than remedying health state, unspecified: Secondary | ICD-10-CM

## 2021-08-31 HISTORY — PX: KNEE ARTHROSCOPY: SHX127

## 2021-08-31 HISTORY — PX: MEDIAL PATELLOFEMORAL LIGAMENT REPAIR: SHX2020

## 2021-08-31 LAB — POCT PREGNANCY, URINE: Preg Test, Ur: NEGATIVE

## 2021-08-31 SURGERY — RECONSTRUCTION, LIGAMENT, MEDIAL PATELLOFEMORAL
Anesthesia: Regional | Site: Knee | Laterality: Right

## 2021-08-31 MED ORDER — KETOROLAC TROMETHAMINE 30 MG/ML IJ SOLN
30.0000 mg | Freq: Once | INTRAMUSCULAR | Status: AC | PRN
  Administered 2021-08-31: 30 mg via INTRAVENOUS

## 2021-08-31 MED ORDER — DOCUSATE SODIUM 100 MG PO CAPS
100.0000 mg | ORAL_CAPSULE | Freq: Two times a day (BID) | ORAL | Status: DC
  Administered 2021-08-31 – 2021-09-01 (×2): 100 mg via ORAL
  Filled 2021-08-31 (×2): qty 1

## 2021-08-31 MED ORDER — METHOCARBAMOL 500 MG PO TABS
500.0000 mg | ORAL_TABLET | Freq: Four times a day (QID) | ORAL | Status: DC | PRN
  Administered 2021-09-01: 500 mg via ORAL
  Filled 2021-08-31: qty 1

## 2021-08-31 MED ORDER — MIDAZOLAM HCL 2 MG/2ML IJ SOLN
INTRAMUSCULAR | Status: AC
  Administered 2021-08-31: 2 mg via INTRAVENOUS
  Filled 2021-08-31: qty 2

## 2021-08-31 MED ORDER — PROMETHAZINE HCL 25 MG/ML IJ SOLN: 6.2500 mg | INTRAMUSCULAR | Status: DC | PRN

## 2021-08-31 MED ORDER — TRANEXAMIC ACID-NACL 1000-0.7 MG/100ML-% IV SOLN
1000.0000 mg | INTRAVENOUS | Status: AC
  Administered 2021-08-31: 1000 mg via INTRAVENOUS
  Filled 2021-08-31: qty 100

## 2021-08-31 MED ORDER — MIDAZOLAM HCL 5 MG/5ML IJ SOLN
INTRAMUSCULAR | Status: DC | PRN
  Administered 2021-08-31: 2 mg via INTRAVENOUS

## 2021-08-31 MED ORDER — CHLORHEXIDINE GLUCONATE 0.12 % MT SOLN
15.0000 mL | Freq: Once | OROMUCOSAL | Status: AC
  Administered 2021-08-31: 15 mL via OROMUCOSAL
  Filled 2021-08-31: qty 15

## 2021-08-31 MED ORDER — PHENYLEPHRINE 40 MCG/ML (10ML) SYRINGE FOR IV PUSH (FOR BLOOD PRESSURE SUPPORT)
PREFILLED_SYRINGE | INTRAVENOUS | Status: DC | PRN
  Administered 2021-08-31 (×5): 40 ug via INTRAVENOUS
  Administered 2021-08-31 (×4): 80 ug via INTRAVENOUS
  Administered 2021-08-31: 40 ug via INTRAVENOUS
  Administered 2021-08-31: 80 ug via INTRAVENOUS

## 2021-08-31 MED ORDER — EPINEPHRINE PF 1 MG/ML IJ SOLN
INTRAMUSCULAR | Status: AC
  Filled 2021-08-31: qty 2

## 2021-08-31 MED ORDER — LACTATED RINGERS IV SOLN: INTRAVENOUS | Status: DC

## 2021-08-31 MED ORDER — MIDAZOLAM HCL 2 MG/2ML IJ SOLN
INTRAMUSCULAR | Status: AC
  Filled 2021-08-31: qty 2

## 2021-08-31 MED ORDER — PROPOFOL 10 MG/ML IV BOLUS
INTRAVENOUS | Status: AC
  Filled 2021-08-31: qty 20

## 2021-08-31 MED ORDER — GABAPENTIN 300 MG PO CAPS
300.0000 mg | ORAL_CAPSULE | Freq: Once | ORAL | Status: AC
  Administered 2021-08-31: 300 mg via ORAL
  Filled 2021-08-31: qty 1

## 2021-08-31 MED ORDER — PROPOFOL 10 MG/ML IV BOLUS
INTRAVENOUS | Status: DC | PRN
  Administered 2021-08-31: 200 mg via INTRAVENOUS

## 2021-08-31 MED ORDER — ONDANSETRON HCL 4 MG/2ML IJ SOLN
INTRAMUSCULAR | Status: AC
  Filled 2021-08-31: qty 2

## 2021-08-31 MED ORDER — CELECOXIB 100 MG PO CAPS
100.0000 mg | ORAL_CAPSULE | Freq: Two times a day (BID) | ORAL | Status: DC
  Filled 2021-08-31: qty 1

## 2021-08-31 MED ORDER — HYDROMORPHONE HCL 1 MG/ML IJ SOLN
0.2500 mg | INTRAMUSCULAR | Status: DC | PRN
  Administered 2021-08-31: 0.5 mg via INTRAVENOUS

## 2021-08-31 MED ORDER — BUPIVACAINE-EPINEPHRINE (PF) 0.5% -1:200000 IJ SOLN
INTRAMUSCULAR | Status: DC | PRN
  Administered 2021-08-31: 30 mL via PERINEURAL

## 2021-08-31 MED ORDER — FENTANYL CITRATE (PF) 100 MCG/2ML IJ SOLN
INTRAMUSCULAR | Status: AC
  Filled 2021-08-31: qty 2

## 2021-08-31 MED ORDER — ONDANSETRON HCL 4 MG/2ML IJ SOLN
4.0000 mg | Freq: Four times a day (QID) | INTRAMUSCULAR | Status: DC | PRN
  Administered 2021-08-31: 4 mg via INTRAVENOUS
  Filled 2021-08-31: qty 2

## 2021-08-31 MED ORDER — LACTATED RINGERS IV SOLN: INTRAVENOUS | Status: AC

## 2021-08-31 MED ORDER — ACETAMINOPHEN 500 MG PO TABS
1000.0000 mg | ORAL_TABLET | Freq: Once | ORAL | Status: AC
  Administered 2021-08-31: 1000 mg via ORAL
  Filled 2021-08-31: qty 2

## 2021-08-31 MED ORDER — AMISULPRIDE (ANTIEMETIC) 5 MG/2ML IV SOLN: 10.0000 mg | Freq: Once | INTRAVENOUS | Status: DC | PRN

## 2021-08-31 MED ORDER — OXYCODONE HCL 5 MG PO TABS
5.0000 mg | ORAL_TABLET | ORAL | Status: DC | PRN
  Administered 2021-09-01: 10 mg via ORAL
  Filled 2021-08-31 (×2): qty 2

## 2021-08-31 MED ORDER — METOCLOPRAMIDE HCL 5 MG/ML IJ SOLN
5.0000 mg | Freq: Three times a day (TID) | INTRAMUSCULAR | Status: DC | PRN
  Administered 2021-08-31: 10 mg via INTRAVENOUS
  Filled 2021-08-31: qty 2

## 2021-08-31 MED ORDER — EPINEPHRINE PF 1 MG/ML IJ SOLN
INTRAMUSCULAR | Status: DC | PRN
  Administered 2021-08-31: 2 mL

## 2021-08-31 MED ORDER — FENTANYL CITRATE (PF) 250 MCG/5ML IJ SOLN
INTRAMUSCULAR | Status: AC
  Filled 2021-08-31: qty 5

## 2021-08-31 MED ORDER — OXYCODONE HCL 5 MG/5ML PO SOLN: 5.0000 mg | Freq: Once | ORAL | Status: AC | PRN

## 2021-08-31 MED ORDER — PHENYLEPHRINE 40 MCG/ML (10ML) SYRINGE FOR IV PUSH (FOR BLOOD PRESSURE SUPPORT)
PREFILLED_SYRINGE | INTRAVENOUS | Status: AC
  Filled 2021-08-31: qty 10

## 2021-08-31 MED ORDER — SODIUM CHLORIDE 0.9 % IR SOLN
Status: DC | PRN
  Administered 2021-08-31: 6000 mL

## 2021-08-31 MED ORDER — MIDAZOLAM HCL 2 MG/2ML IJ SOLN: 2.0000 mg | Freq: Once | INTRAMUSCULAR | Status: AC

## 2021-08-31 MED ORDER — FENTANYL CITRATE (PF) 100 MCG/2ML IJ SOLN
INTRAMUSCULAR | Status: DC | PRN
  Administered 2021-08-31: 25 ug via INTRAVENOUS
  Administered 2021-08-31 (×4): 50 ug via INTRAVENOUS
  Administered 2021-08-31: 25 ug via INTRAVENOUS

## 2021-08-31 MED ORDER — KETOROLAC TROMETHAMINE 30 MG/ML IJ SOLN
INTRAMUSCULAR | Status: AC
  Filled 2021-08-31: qty 1

## 2021-08-31 MED ORDER — LIDOCAINE 2% (20 MG/ML) 5 ML SYRINGE: INTRAMUSCULAR | Status: DC | PRN

## 2021-08-31 MED ORDER — CEFAZOLIN SODIUM-DEXTROSE 1-4 GM/50ML-% IV SOLN
1.0000 g | Freq: Three times a day (TID) | INTRAVENOUS | Status: AC
  Administered 2021-08-31 – 2021-09-01 (×2): 1 g via INTRAVENOUS
  Filled 2021-08-31 (×3): qty 50

## 2021-08-31 MED ORDER — ACETAMINOPHEN 325 MG PO TABS: 325.0000 mg | ORAL_TABLET | Freq: Four times a day (QID) | ORAL | Status: DC | PRN

## 2021-08-31 MED ORDER — ASPIRIN EC 325 MG PO TBEC
325.0000 mg | DELAYED_RELEASE_TABLET | Freq: Every day | ORAL | Status: DC
  Administered 2021-09-01: 325 mg via ORAL
  Filled 2021-08-31: qty 1

## 2021-08-31 MED ORDER — HYDROMORPHONE HCL 1 MG/ML IJ SOLN
0.5000 mg | INTRAMUSCULAR | Status: DC | PRN
  Administered 2021-08-31 – 2021-09-01 (×4): 0.5 mg via INTRAVENOUS
  Filled 2021-08-31 (×5): qty 0.5

## 2021-08-31 MED ORDER — DEXAMETHASONE SODIUM PHOSPHATE 10 MG/ML IJ SOLN
INTRAMUSCULAR | Status: AC
  Filled 2021-08-31: qty 1

## 2021-08-31 MED ORDER — ONDANSETRON HCL 4 MG PO TABS: 4.0000 mg | ORAL_TABLET | Freq: Four times a day (QID) | ORAL | Status: DC | PRN

## 2021-08-31 MED ORDER — LIDOCAINE HCL (CARDIAC) PF 100 MG/5ML IV SOSY
PREFILLED_SYRINGE | INTRAVENOUS | Status: DC | PRN
  Administered 2021-08-31: 40 mg via INTRAVENOUS

## 2021-08-31 MED ORDER — ORAL CARE MOUTH RINSE: 15.0000 mL | Freq: Once | OROMUCOSAL | Status: AC

## 2021-08-31 MED ORDER — OXYCODONE HCL 5 MG PO TABS
ORAL_TABLET | ORAL | Status: AC
  Filled 2021-08-31: qty 1

## 2021-08-31 MED ORDER — FENTANYL CITRATE (PF) 100 MCG/2ML IJ SOLN
50.0000 ug | Freq: Once | INTRAMUSCULAR | Status: AC
  Administered 2021-08-31: 50 ug via INTRAVENOUS

## 2021-08-31 MED ORDER — ACETAMINOPHEN 500 MG PO TABS
1000.0000 mg | ORAL_TABLET | Freq: Four times a day (QID) | ORAL | Status: AC
  Administered 2021-08-31 – 2021-09-01 (×4): 1000 mg via ORAL
  Filled 2021-08-31 (×4): qty 2

## 2021-08-31 MED ORDER — BUPIVACAINE-EPINEPHRINE (PF) 0.25% -1:200000 IJ SOLN
INTRAMUSCULAR | Status: AC
  Filled 2021-08-31: qty 30

## 2021-08-31 MED ORDER — CEFAZOLIN SODIUM-DEXTROSE 2-4 GM/100ML-% IV SOLN
2.0000 g | INTRAVENOUS | Status: AC
  Administered 2021-08-31: 2 g via INTRAVENOUS
  Filled 2021-08-31: qty 100

## 2021-08-31 MED ORDER — METHOCARBAMOL 1000 MG/10ML IJ SOLN
500.0000 mg | Freq: Four times a day (QID) | INTRAVENOUS | Status: DC | PRN
  Filled 2021-08-31: qty 5

## 2021-08-31 MED ORDER — FENTANYL CITRATE (PF) 100 MCG/2ML IJ SOLN
25.0000 ug | INTRAMUSCULAR | Status: DC | PRN
  Administered 2021-08-31 (×3): 50 ug via INTRAVENOUS

## 2021-08-31 MED ORDER — DEXAMETHASONE SODIUM PHOSPHATE 4 MG/ML IJ SOLN
INTRAMUSCULAR | Status: DC | PRN
  Administered 2021-08-31: 10 mg via INTRAVENOUS

## 2021-08-31 MED ORDER — METOCLOPRAMIDE HCL 5 MG PO TABS: 5.0000 mg | ORAL_TABLET | Freq: Three times a day (TID) | ORAL | Status: DC | PRN

## 2021-08-31 MED ORDER — ONDANSETRON HCL 4 MG/2ML IJ SOLN
INTRAMUSCULAR | Status: DC | PRN
  Administered 2021-08-31: 4 mg via INTRAVENOUS

## 2021-08-31 MED ORDER — OXYCODONE HCL 5 MG PO TABS
5.0000 mg | ORAL_TABLET | Freq: Once | ORAL | Status: AC | PRN
  Administered 2021-08-31: 5 mg via ORAL

## 2021-08-31 MED ORDER — HYDROMORPHONE HCL 1 MG/ML IJ SOLN
INTRAMUSCULAR | Status: AC
  Filled 2021-08-31: qty 1

## 2021-08-31 MED ORDER — LIDOCAINE 2% (20 MG/ML) 5 ML SYRINGE
INTRAMUSCULAR | Status: AC
  Filled 2021-08-31: qty 5

## 2021-08-31 SURGICAL SUPPLY — 99 items
ALCOHOL 70% 16 OZ (MISCELLANEOUS) ×2 IMPLANT
ANCHOR SUT FBRTK 2.6 KNTLS (Anchor) ×4 IMPLANT
ANCHOR SUT SWIVELLOK BIO (Anchor) ×2 IMPLANT
BAG COUNTER SPONGE SURGICOUNT (BAG) ×2 IMPLANT
BANDAGE ESMARK 6X9 LF (GAUZE/BANDAGES/DRESSINGS) IMPLANT
BIT DRILL CALIBRATED AO 3.5 (DRILL) ×1 IMPLANT
BIT DRILL MATTA 2.5MX180M (BIT) ×1 IMPLANT
BLADE CLIPPER SURG (BLADE) IMPLANT
BLADE EXCALIBUR 4.0X13 (MISCELLANEOUS) ×2 IMPLANT
BLADE LONG MED 31X9 (MISCELLANEOUS) ×2 IMPLANT
BNDG COHESIVE 6X5 TAN STRL LF (GAUZE/BANDAGES/DRESSINGS) ×2 IMPLANT
BNDG ELASTIC 4X5.8 VLCR STR LF (GAUZE/BANDAGES/DRESSINGS) ×2 IMPLANT
BNDG ELASTIC 6X10 VLCR STRL LF (GAUZE/BANDAGES/DRESSINGS) IMPLANT
BNDG ELASTIC 6X5.8 VLCR STR LF (GAUZE/BANDAGES/DRESSINGS) ×2 IMPLANT
BNDG ESMARK 6X9 LF (GAUZE/BANDAGES/DRESSINGS)
CHLORAPREP W/TINT 26 (MISCELLANEOUS) ×2 IMPLANT
CLSR STERI-STRIP ANTIMIC 1/2X4 (GAUZE/BANDAGES/DRESSINGS) ×2 IMPLANT
COVER SURGICAL LIGHT HANDLE (MISCELLANEOUS) ×2 IMPLANT
CUFF TOURN SGL QUICK 34 (TOURNIQUET CUFF) ×1
CUFF TOURN SGL QUICK 42 (TOURNIQUET CUFF) IMPLANT
CUFF TRNQT CYL 34X4.125X (TOURNIQUET CUFF) ×1 IMPLANT
DERMABOND ADHESIVE PROPEN (GAUZE/BANDAGES/DRESSINGS) ×1
DERMABOND ADVANCED .7 DNX6 (GAUZE/BANDAGES/DRESSINGS) ×1 IMPLANT
DRAPE ARTHROSCOPY W/POUCH 114 (DRAPES) ×2 IMPLANT
DRAPE C-ARM 42X72 X-RAY (DRAPES) ×2 IMPLANT
DRAPE C-ARMOR (DRAPES) ×2 IMPLANT
DRAPE HALF SHEET 40X57 (DRAPES) ×2 IMPLANT
DRAPE IMP U-DRAPE 54X76 (DRAPES) ×2 IMPLANT
DRAPE INCISE IOBAN 66X45 STRL (DRAPES) IMPLANT
DRAPE ORTHO SPLIT 77X108 STRL (DRAPES) ×2
DRAPE SURG ORHT 6 SPLT 77X108 (DRAPES) ×2 IMPLANT
DRAPE U-SHAPE 47X51 STRL (DRAPES) ×2 IMPLANT
DRESSING AQUACEL AG SP 3.5X6 (GAUZE/BANDAGES/DRESSINGS) ×1 IMPLANT
DRILL BIT MATTA 2.5MX180M (BIT) ×2
DRILL CALIBRATED AO 3.5 (DRILL) ×2
DRSG AQUACEL AG SP 3.5X6 (GAUZE/BANDAGES/DRESSINGS) ×2
DRSG PAD ABDOMINAL 8X10 ST (GAUZE/BANDAGES/DRESSINGS) ×4 IMPLANT
DRSG TEGADERM 2-3/8X2-3/4 SM (GAUZE/BANDAGES/DRESSINGS) ×4 IMPLANT
DRSG TEGADERM 4X4.75 (GAUZE/BANDAGES/DRESSINGS) ×2 IMPLANT
DURAPREP 26ML APPLICATOR (WOUND CARE) ×2 IMPLANT
DW OUTFLOW CASSETTE/TUBE SET (MISCELLANEOUS) ×2 IMPLANT
ELECT REM PT RETURN 9FT ADLT (ELECTROSURGICAL) ×2
ELECTRODE REM PT RTRN 9FT ADLT (ELECTROSURGICAL) ×1 IMPLANT
GAUZE SPONGE 4X4 12PLY STRL (GAUZE/BANDAGES/DRESSINGS) ×2 IMPLANT
GAUZE SPONGE 4X4 12PLY STRL LF (GAUZE/BANDAGES/DRESSINGS) ×2 IMPLANT
GAUZE XEROFORM 1X8 LF (GAUZE/BANDAGES/DRESSINGS) ×2 IMPLANT
GLOVE SRG 8 PF TXTR STRL LF DI (GLOVE) ×2 IMPLANT
GLOVE SURG ENC MOIS LTX SZ6 (GLOVE) ×2 IMPLANT
GLOVE SURG ENC MOIS LTX SZ7.5 (GLOVE) ×4 IMPLANT
GLOVE SURG LTX SZ8 (GLOVE) ×2 IMPLANT
GLOVE SURG UNDER POLY LF SZ6.5 (GLOVE) ×2 IMPLANT
GLOVE SURG UNDER POLY LF SZ8 (GLOVE) ×2
GOWN STRL REUS W/ TWL LRG LVL3 (GOWN DISPOSABLE) ×2 IMPLANT
GOWN STRL REUS W/ TWL XL LVL3 (GOWN DISPOSABLE) ×2 IMPLANT
GOWN STRL REUS W/TWL LRG LVL3 (GOWN DISPOSABLE) ×2
GOWN STRL REUS W/TWL XL LVL3 (GOWN DISPOSABLE) ×2
GUIDEPIN TT 1.5X150 (PIN) ×4 IMPLANT
KIT ANCHOR FBRTK 2.6 STR (KITS) ×2 IMPLANT
KIT BASIN OR (CUSTOM PROCEDURE TRAY) ×2 IMPLANT
KIT TRANSTIBIAL (DISPOSABLE) ×2 IMPLANT
KIT TURNOVER KIT B (KITS) ×2 IMPLANT
MANIFOLD NEPTUNE II (INSTRUMENTS) ×2 IMPLANT
NDL SUT 6 .5 CRC .975X.05 MAYO (NEEDLE) ×1 IMPLANT
NEEDLE 18GX1X1/2 (RX/OR ONLY) (NEEDLE) IMPLANT
NEEDLE HYPO 25GX1X1/2 BEV (NEEDLE) ×2 IMPLANT
NEEDLE MAYO TAPER (NEEDLE) ×1
NS IRRIG 1000ML POUR BTL (IV SOLUTION) ×2 IMPLANT
PACK ARTHROSCOPY DSU (CUSTOM PROCEDURE TRAY) ×2 IMPLANT
PACK GENERAL/GYN (CUSTOM PROCEDURE TRAY) ×2 IMPLANT
PAD ARMBOARD 7.5X6 YLW CONV (MISCELLANEOUS) ×4 IMPLANT
PADDING CAST COTTON 6X4 STRL (CAST SUPPLIES) ×2 IMPLANT
PORT APPOLLO RF 90DEGREE MULTI (SURGICAL WAND) ×2 IMPLANT
SCREW BIOCOMP 7X20 (Screw) ×2 IMPLANT
SCREW CORT 3.5X40 LP ANKLE (Screw) ×4 IMPLANT
SCREW LP CORT 3.5X44 (Screw) ×2 IMPLANT
SCREW NLOCK FMS 3.5X46 (Screw) ×2 IMPLANT
SPONGE T-LAP 4X18 ~~LOC~~+RFID (SPONGE) ×2 IMPLANT
STAPLER VISISTAT 35W (STAPLE) ×2 IMPLANT
STOCKINETTE IMPERVIOUS 9X36 MD (GAUZE/BANDAGES/DRESSINGS) ×2 IMPLANT
SUT ETHILON 2 0 FS 18 (SUTURE) IMPLANT
SUT ETHILON 3 0 PS 1 (SUTURE) ×2 IMPLANT
SUT FIBERWIRE #2 38 T-5 BLUE (SUTURE) ×2
SUT PDS AB 0 CT 36 (SUTURE) IMPLANT
SUT VIC AB 0 CT1 27 (SUTURE)
SUT VIC AB 0 CT1 27XBRD ANBCTR (SUTURE) IMPLANT
SUT VIC AB 2-0 CT1 27 (SUTURE)
SUT VIC AB 2-0 CT1 TAPERPNT 27 (SUTURE) IMPLANT
SUTURE FIBERWR #2 38 T-5 BLUE (SUTURE) ×1 IMPLANT
SUTURE TAPE 1.3 FIBERLOP 20 ST (SUTURE) ×2 IMPLANT
SUTURETAPE 1.3 FIBERLOOP 20 ST (SUTURE) ×4
SYR 20ML ECCENTRIC (SYRINGE) ×2 IMPLANT
SYR CONTROL 10ML LL (SYRINGE) IMPLANT
SYR TB 1ML LUER SLIP (SYRINGE) ×2 IMPLANT
TENDON SEMI-TENDINOSUS (Bone Implant) ×2 IMPLANT
TOWEL GREEN STERILE (TOWEL DISPOSABLE) ×4 IMPLANT
TOWEL GREEN STERILE FF (TOWEL DISPOSABLE) ×2 IMPLANT
TUBE CONNECTING 12X1/4 (SUCTIONS) ×2 IMPLANT
TUBING ARTHROSCOPY IRRIG 16FT (MISCELLANEOUS) ×2 IMPLANT
WATER STERILE IRR 1000ML POUR (IV SOLUTION) ×2 IMPLANT

## 2021-08-31 NOTE — Op Note (Signed)
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  She presented with an elevated TT TG of 18.  The risk and benefits of the procedure with discussed in detail and documented in the pre-operative evaluation.  PREOPERATIVE DIAGNOSIS: 1.  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  Lateral meniscus tear   POSTOPERATIVE DIAGNOSIS: Same.  PROCEDURE: 1.  Right knee MPFL reconstruction 2.  Right knee tibial tubercle osteotomy 3.  Right knee diagnostic arthroscopy 4.  Lateral meniscus debridement   SURGEON: Benancio Deeds MD  ASSISTANT: Olga Millers, ATC; necessary for the timely completion of procedure and due to complexity of procedure.  ANESTHESIA:  general  IV FLUIDS AND URINE: See anesthesia record.  ANTIBIOTICS: Ancef 2 g  ESTIMATED BLOOD LOSS: 25 mL.  IMPLANTS:  Implant Name Type Inv. Item Serial No. Manufacturer Lot No. LRB No. Used Action  TENDON SEMI-TENDINOSUS - [id number] Bone Implant TENDON SEMI-TENDINOSUS [id number] [employer]  Right 1 Implanted  ANCHOR SUT FBRTK 2.6 KNTLS - [id number] Anchor ANCHOR SUT FBRTK 2.6 KNTLS  [employer] [id number] Right 2 Implanted  ANCHOR SUT SWIVELLOK BIO - [id number] Anchor ANCHOR [employer] [id number] Right 1 Implanted  SCREW BIOCOMP 7X20 - [id number] Screw SCREW BIOCOMP 7X20  [employer] [id number] Right 1 Implanted  SCREW CORT 3.5X40 LP ANKLE - [id number] Screw SCREW CORT 3.5X40 LP ANKLE  [employer]  Right 1 Implanted  SCREW LP CORT 3.5X44 - [id number] Screw SCREW LP CORT 3.5X44  [employer]  Right 1 Implanted    DRAINS: None  CULTURES: None  COMPLICATIONS: none  DESCRIPTION OF PROCEDURE:  Examination under anesthesia: A careful examination under anesthesia was performed.  Knee ROM motion was: -5-35 Lachman: Normal Pivot Shift: Normal Posterior drawer: normal.   Varus  stability in full extension: normal.   Varus stability in 30 degrees of flexion: normal.  Valgus stability in full extension: normal.   Valgus stability in 30 degrees of flexion: normal.  Posterolateral drawer: normal  Grossly positive lateral patellar shift with near dislocation   Intra-operative findings: A thorough arthroscopic examination of the knee was performed.  The findings are: 1. Suprapatellar pouch: Normal 2. Undersurface of median ridge: Normal 3. Medial patellar facet: Normal 4. Lateral patellar facet: Normal 5. Trochlea: Normal 6. Lateral gutter/popliteus tendon: Normal 7. Hoffa's fat pad: Normal 8. Medial gutter/plica: Normal 9. ACL: Normal 10. PCL: Normal 11. Medial meniscus: Normal 12. Medial compartment cartilage: Normal 13. Lateral meniscus: Fraying of the anterior horn 14. Lateral compartment cartilage: Normal  Patient was identified in the preoperative holding area the correct site was marked along with nursing.  Neuro blockade was performed by anesthesia.  She was subsequently taken back to the operating room.  She was prepped and draped in the usual sterile fashion.  We began with diagnostic arthroscopy.  A standard anterior lateral portal was utilized.  This was made with 15 blade.  Diagnostic arthroscopy ensued with the findings above.  It was noticed that she had significant lateral patellar tracking during this portion procedure.  We subsequently created an anterior medial portal under direct fluoroscopic visualization.  There was mild fraying about the anterior aspect of the lateral meniscus.  A shaver was introduced and debrided this back to healthy appearing meniscus.  The entirety of the meniscus remained intact.  The root was stable.  A 10cm longitudinal incision medial to the  tibial tubercle was made and full thickness medial and lateral fasciocutaneous flaps were elevated. Small lateral and medial parapatellar arthrotomies were made and an [employer]  retractor was placed underneath the patellar tendon for the remainder of the osteotomy.  I then elevated the anterior compartment musculature off the anterolateral tibia in a subperiosteal fashion.  This allowed 2 blunt Hohmann retractors to be placed around the posterolateral tibia to protect the neurovascular structures during the osteotomy.  At that point, an osteotomy was planned along the medial tibia in an oblique fashion from  posterior proximal to distal anterior, and the anticipated path of the osteotomy was marked with a Bovie in the periosteum. An oscillating saw was used to create the osteotomy cut in an oblique fashion at to optimize both the medialization as well as anteriorization. Once this saw blade cut was performed and a distal cortical hinge was left intact, an osteotome was used to make the oblique cuts as well as the transverse cut proximal to the tibial tubercle while protecting the patellar tendon.  This allowed the tubercle to be mobilized, while preserving the distal cortical and periosteal hinge.  The osteotomy shingle was then anteromedialized 10 mm, based on preoperative planning.  This was temporarily fixed with a bicortical K-wire.  Then, under direct fluoroscopic visualization, fixation was achieved with 3x 3.5 mm solid cortical screws using lag by technique.  The holes were countersunk and screws of the appropriate depth were inserted sequentially.  This afforded appropriate fixation of the tibial tubercle osteotomy.  Final position of the screws and the osteotomy was confirmed on AP and lateral fluoroscopic images and was found to be in excellent position.     At this point, I directed my attention to medial patellofemoral ligament reconstruction.  On the back table, a semitendinosus allograft was prepared in the standard fashion.     At this point, a 15 blade was used to dissect layers 1 and 2 off the medial patella, and I then bluntly developed the interval between layers 2  and 3.  This was taken bluntly all the way to the level of the medial epicondyle.   At this point, electrocautery was used to remove soft tissue from the medial aspect of the patella from the proximal pole of the equator. A small osteophyte was encountered there, likely from a prior avulsion fracture.  This was removed with a rongeur.  The medial edge of the patella was debrided with a rongeur to create a bleeding trough of bone to accommodate the graft.  Fluoroscopy was then used to confirm the position of 2 anticipated anchors on the medial patella.  One was placed at the equator, the other halfway between the equator and the superior pole in the native footprint of the MPFL.  These were placed in standard fashion and were both Ar[employer] fibertak anchors.  Excellent purchase was achieved in the patella.  These were then reserved for later use.   At this point, the medial epicondylar incision was localized with fluoroscopy and created with a 15 blade.  Sharp dissection was carried down to the fascia.  The fascia was then sharply incised and a Kelly clamp was placed between layers 2 and 3, from the level of the patella, and brought out the medial epicondylar incision, guaranteeing a good soft tissue tunnel for passage of the graft.   At this point, a guide pin was placed at [facility]' point on a perfect lateral radiograph and this position was confirmed by direct palpation  in between the medial epicondyle and the adductor tubercle. The pilot hole was drilled aiming anterior and proximal, and the anchor was easily inserted and deployed.   At this point, the looped end of the semitendinosus allograft was then placed into the femoral incision.  Excellent fixation was achieved by using both sets of sutures creating locked loops around the graft. At this point, the 2 free limbs were shuttled in between layers 2 and 3 and brought out the patellar incision.   Patellar fixation was performed with the knee in 30  degrees of flexion with the patella centered in the trochlear groove.  Modified Krackow stitches were used to affix both limbs to their respective suture anchors on the patella.  Excess suture length was removed. With the knee in the same position of flexion, a medial reefing was performed to close layers 1 and 2 in a pants-over-vest fashion.   Excellent hemostasis was visualized.  Leg compartments were all soft at the completion of the procedure.  The wound was irrigated with 1 L of saline.  The main incision was then closed in layers with 0 Vicryl 2-0 Vicryl and 3-0 Monocryl. Repeat EUA showed central patellar tracking, with a stable patella throughout full range of motion.  Full extension and 135 of flexion were preserved without tension on the MPFL or the osteotomy.       The patient awoke from anesthesia without difficulty and was transferred to the PACU in stable condition.     Olga Millers ATC was necessary for opening, closing, retracting, limb positioning and overall facilitation and timely completion of the procedure.     POSTOPERATIVE PLAN: The patient will be nonweightbearing on the right lower extremity for a total of 2 weeks.  Following this we will progress her motion according to protocol.  She will stay overnight for compartment monitoring.  She will stay in her brace locked in extension  Benancio Deeds, MD 2:22 PM

## 2021-08-31 NOTE — Anesthesia Procedure Notes (Signed)
Procedure Name: LMA Insertion Date/Time: 08/31/2021 11:34 AM Performed by: Shary Decamp, CRNA Pre-anesthesia Checklist: Patient identified, Patient being monitored, Timeout performed, Emergency Drugs available and Suction available Patient Re-evaluated:Patient Re-evaluated prior to induction Oxygen Delivery Method: Circle System Utilized Preoxygenation: Pre-oxygenation with 100% oxygen Induction Type: IV induction Ventilation: Mask ventilation without difficulty LMA Size: 4.0 Number of attempts: 1 Airway Equipment and Method: Stylet Placement Confirmation: ETT inserted through vocal cords under direct vision, positive ETCO2 and breath sounds checked- equal and bilateral Tube secured with: Tape Dental Injury: Teeth and Oropharynx as per pre-operative assessment

## 2021-08-31 NOTE — Anesthesia Postprocedure Evaluation (Signed)
Anesthesia Post Note  Patient: Sharon Powell  Procedure(s) Performed: RIGHT MEDIAL PATELLAFEMORAL RECONSTRUCTION WITH TIBIAL TUBERCLE OSTEOTOMY, RIGHT KNEE ARTHROSCOPY LATERAL MENISCAL DEBRIDEMENT (Right: Knee) ARTHROSCOPY KNEE (Right: Knee)     Patient location during evaluation: PACU Anesthesia Type: Regional and General Level of consciousness: awake Pain management: pain level controlled Vital Signs Assessment: post-procedure vital signs reviewed and stable Respiratory status: spontaneous breathing, nonlabored ventilation, respiratory function stable and patient connected to nasal cannula oxygen Cardiovascular status: blood pressure returned to baseline and stable Postop Assessment: no apparent nausea or vomiting Anesthetic complications: no   No notable events documented.  Last Vitals:  Vitals:   08/31/21 1621 08/31/21 1643  BP: 118/84 113/83  Pulse: 84 73  Resp: 13 18  Temp: (!) 36.1 C 36.9 C  SpO2: 97% 98%    Last Pain:  Vitals:   08/31/21 1739  TempSrc:   PainSc: 9                  [name] P Maxfield Gildersleeve

## 2021-08-31 NOTE — Progress Notes (Signed)
Patient arrived from PACU at 1645. VSS. Tolerating food and liquids. Drsg intact. Ice applied. Brace remains in place. Mother at bedside. Patient made aware about NWB status. C/o pain, provider paged to placed orders. No further complaints at this time.

## 2021-08-31 NOTE — Transfer of Care (Signed)
Immediate Anesthesia Transfer of Care Note  Patient: Sharon Powell  Procedure(s) Performed: RIGHT MEDIAL PATELLAFEMORAL RECONSTRUCTION WITH TIBIAL TUBERCLE OSTEOTOMY, RIGHT KNEE ARTHROSCOPY LATERAL MENISCAL DEBRIDEMENT (Right: Knee) ARTHROSCOPY KNEE (Right: Knee)  Patient Location: PACU  Anesthesia Type:General and Regional  Level of Consciousness: awake  Airway & Oxygen Therapy: Patient Spontanous Breathing  Post-op Assessment: Report given to RN  Post vital signs: Reviewed  Last Vitals:  Vitals Value Taken Time  BP 127/102 08/31/21 1536  Temp 36.3 C 08/31/21 1421  Pulse 74 08/31/21 1537  Resp 13 08/31/21 1537  SpO2 97 % 08/31/21 1537  Vitals shown include unvalidated device data.  Last Pain:  Vitals:   08/31/21 1522  TempSrc:   PainSc: 10-Worst pain ever      Patients Stated Pain Goal: 1 (08/31/21 0924)  Complications: No notable events documented.

## 2021-08-31 NOTE — Plan of Care (Signed)
Patient seen and examined at the bedside. Compartments are soft and compressible in right leg. SILT all distribution of right foot. Dressing CDI. Fires EHL/TA/GS. Having some nausea but pain better controlled.

## 2021-08-31 NOTE — Interval H&P Note (Signed)
History and Physical Interval Note:  08/31/2021 10:49 AM  Sharon Powell  has presented today for surgery, with the diagnosis of RIGHT KNEE PATELLAR INSTABILITY.  The various methods of treatment have been discussed with the patient and family. After consideration of risks, benefits and other options for treatment, the patient has consented to  Procedure(s): RIGHT MEDIAL PATELLAFEMORAL RECONSTRUCTION WITH TIBIAL TUBERCLE OSTEOTOMY, RIGHT KNEE ARTHROSCOPY (Right) ARTHROSCOPY KNEE (Right) as a surgical intervention.  The patient's history has been reviewed, patient examined, no change in status, stable for surgery.  I have reviewed the patient's chart and labs.  Questions were answered to the patient's satisfaction.     [name]

## 2021-08-31 NOTE — Progress Notes (Signed)
Two earrings, two nose rings, 2  rings, 2 bracelets, and 2 necklaces taken to the lobby and given to patient's mother Ryshauna Jerkins.

## 2021-08-31 NOTE — Anesthesia Preprocedure Evaluation (Addendum)
Anesthesia Evaluation  Patient identified by MRN, date of birth, ID band Patient awake    Reviewed: Allergy & Precautions, NPO status , Patient's Chart, lab work & pertinent test results  Airway Mallampati: II  TM Distance: >3 FB Neck ROM: Full    Dental no notable dental hx.    Pulmonary neg pulmonary ROS,    Pulmonary exam normal breath sounds clear to auscultation       Cardiovascular negative cardio ROS Normal cardiovascular exam Rhythm:Regular Rate:Normal     Neuro/Psych  Headaches, negative psych ROS   GI/Hepatic negative GI ROS, Neg liver ROS,   Endo/Other  negative endocrine ROS  Renal/GU negative Renal ROS     Musculoskeletal Scoliosis   Abdominal   Peds  Hematology  (+) anemia ,   Anesthesia Other Findings RIGHT KNEE PATELLAR INSTABILITY  Reproductive/Obstetrics hcg negative                            Anesthesia Physical Anesthesia Plan  ASA: 2  Anesthesia Plan: General and Regional   Post-op Pain Management:    Induction: Intravenous  PONV Risk Score and Plan: 3 and Ondansetron, Dexamethasone, Midazolam and Treatment may vary due to age or medical condition  Airway Management Planned: LMA  Additional Equipment:   Intra-op Plan:   Post-operative Plan: Extubation in OR  Informed Consent: I have reviewed the patients History and Physical, chart, labs and discussed the procedure including the risks, benefits and alternatives for the proposed anesthesia with the patient or authorized representative who has indicated his/her understanding and acceptance.     Dental advisory given  Plan Discussed with: CRNA  Anesthesia Plan Comments:        Anesthesia Quick Evaluation

## 2021-08-31 NOTE — Discharge Instructions (Signed)
     Discharge Instructions    Attending Surgeon: [name], MD Office Phone Number: [phone]   Diagnosis and Procedures:    Surgeries Performed: Right knee MPFL reconstruction with tibial tubercle osteotomy  Discharge Plan:    Diet: Resume usual diet. Begin with light or bland foods.  Drink plenty of fluids.  Activity:  Non-weightbearing, unless otherwise instructed, utilizing crutches, until seen at postoperative Physical Therapy visit this week. Please keep your brace locked until follow-up. You are advised to go home directly from the hospital or surgical center. Restrict your activities.  GENERAL INSTRUCTIONS: 1.  Keep your surgical site elevated above your heart for at least 5-7 days or longer to prevent swelling. This will improve your comfort and your overall recovery following surgery.     2. Please call Dr. Serena Croissant office at [phone] with questions Monday-Friday during business hours. If no one answers, please leave a message and someone should get back to the patient within 24 hours. For emergencies please call 911 or proceed to the emergency room.   3. Patient to notify surgical team if experiences any of the following: Bowel/Bladder dysfunction, uncontrolled pain, nerve/muscle weakness, incision with increased drainage or redness, nausea/vomiting and Fever greater than 101.0 F.  Be alert for signs of infection including redness, streaking, odor, fever or chills. Be alert for excessive pain or bleeding and notify your surgeon immediately.  WOUND INSTRUCTIONS:   Leave your dressing/cast/splint in place until your post operative visit.  Keep it clean and dry.  Always keep the incision clean and dry until the staples/sutures are removed. If there is no drainage from the incision you should keep it open to air. If there is drainage from the incision you must keep it covered at all times until the drainage stops  Do not soak in a bath tub, hot tub, pool,  lake or other body of water until 21 days after your surgery and your incision is completely dry and healed.  If you have removable sutures (or staples) they must be removed 10-14 days (unless otherwise instructed) from the day of your surgery.     1)  Elevate the extremity as much as possible.  2)  Keep the dressing clean and dry.  3)  Please call us if the dressing becomes wet or dirty.  4)  If you are experiencing worsening pain or worsening swelling, please call.     MEDICATIONS: Resume all previous home medications at the previous prescribed dose and frequency unless otherwise noted Start taking the  pain medications on an as-needed basis as prescribed  Please taper down pain medication over the next week following surgery.  Ideally you should not require a refill of any narcotic pain medication.  Take pain medication with food to minimize nausea. In addition to the prescribed pain medication, you may take over-the-counter pain relievers such as Tylenol.  Do NOT take additional tylenol if your pain medication already has tylenol in it.  Aspirin 325mg  daily for four weeks.      FOLLOWUP INSTRUCTIONS: 1. Follow up at the Physical Therapy Clinic 3-4 days following surgery. This appointment should be scheduled unless other arrangements have been made.The Physical Therapy scheduling number is [phone] if an appointment has not already been arranged.  2. Contact Dr. [phone] office during office hours at [phone] or the practice after hours line at [phone] for non-emergencies. For medical emergencies call 911.   Discharge Location: Home

## 2021-08-31 NOTE — Anesthesia Procedure Notes (Signed)
Anesthesia Regional Block: Adductor canal block   Pre-Anesthetic Checklist: , timeout performed,  Correct Patient, Correct Site, Correct Laterality,  Correct Procedure,, site marked,  Risks and benefits discussed,  Surgical consent,  Pre-op evaluation,  At surgeon's request and post-op pain management  Laterality: Right  Prep: chloraprep       Needles:  Injection technique: Single-shot  Needle Type: Echogenic Stimulator Needle     Needle Length: 9cm  Needle Gauge: 21     Additional Needles:   Procedures:,,,, ultrasound used (permanent image in chart),,    Narrative:  Start time: 08/31/2021 10:10 AM End time: 08/31/2021 10:20 AM Injection made incrementally with aspirations every 5 mL.  Performed by: Personally  Anesthesiologist: Leonides Grills, MD  Additional Notes: Functioning IV was confirmed and monitors were applied. A time-out was performed. Hand hygiene and sterile gloves were used. The thigh was placed in a frog-leg position and prepped in a sterile fashion. A 20mm 21ga Arrow echogenic stimulator needle was placed using ultrasound guidance.  Negative aspiration and negative test dose prior to incremental administration of local anesthetic. The patient tolerated the procedure well.

## 2021-08-31 NOTE — Brief Op Note (Signed)
   Brief Op Note  Date of Surgery: 08/31/2021  Preoperative Diagnosis: RIGHT KNEE PATELLAR INSTABILITY  Postoperative Diagnosis: same  Procedure: Procedure(s): RIGHT MEDIAL PATELLAFEMORAL RECONSTRUCTION WITH TIBIAL TUBERCLE OSTEOTOMY, RIGHT KNEE ARTHROSCOPY LATERAL MENISCAL DEBRIDEMENT ARTHROSCOPY KNEE  Implants: Implant Name Type Inv. Item Serial No. Manufacturer Lot No. LRB No. Used Action  TENDON SEMI-TENDINOSUS - [id number] Bone Implant TENDON SEMI-TENDINOSUS [id number] [employer]  Right 1 Implanted  ANCHOR SUT FBRTK 2.6 KNTLS - [id number] Anchor ANCHOR SUT FBRTK 2.6 KNTLS  [employer] [id number] Right 2 Implanted  ANCHOR SUT SWIVELLOK BIO - [id number] Anchor ANCHOR SUT SWIVELLOK BIO  [employer] [id number] Right 1 Implanted  SCREW BIOCOMP 7X20 - [id number] Screw SCREW BIOCOMP 7X20  [employer] [id number] Right 1 Implanted  SCREW CORT 3.5X40 LP ANKLE - [id number] Screw SCREW CORT 3.5X40 LP ANKLE  [employer]  Right 1 Implanted  SCREW LP CORT 3.5X44 - [id number] Screw SCREW LP CORT 3.5X44  [employer]  Right 1 Implanted    Surgeons: Surgeon(s): [name], MD  Anesthesia: Choice    Estimated Blood Loss: See anesthesia record  Complications: None  Condition to PACU: Stable  Benancio Deeds, MD 08/31/2021 2:16 PM

## 2021-09-01 ENCOUNTER — Telehealth: Payer: Self-pay | Admitting: Orthopaedic Surgery

## 2021-09-01 ENCOUNTER — Other Ambulatory Visit (HOSPITAL_COMMUNITY): Payer: Self-pay

## 2021-09-01 DIAGNOSIS — S83011A Lateral subluxation of right patella, initial encounter: Secondary | ICD-10-CM | POA: Diagnosis not present

## 2021-09-01 MED ORDER — SCOPOLAMINE 1 MG/3DAYS TD PT72
1.0000 | MEDICATED_PATCH | TRANSDERMAL | Status: DC
  Administered 2021-09-01: 1.5 mg via TRANSDERMAL
  Filled 2021-09-01: qty 1

## 2021-09-01 MED ORDER — SCOPOLAMINE 1 MG/3DAYS TD PT72
1.0000 | MEDICATED_PATCH | TRANSDERMAL | 12 refills | Status: AC
  Filled 2021-09-01: qty 10, 30d supply, fill #0

## 2021-09-01 NOTE — Progress Notes (Signed)
  Mobility Specialist Criteria Algorithm Info.  Mobility Team: [facility] elevated:Self regulated Activity: Ambulated in room; Dangled on edge of bed (in chair before and after ambulation) Range of motion: Active; All extremities Level of assistance: Minimal assist, patient does 75% or more Assistive device: Front wheel walker Minutes sitting in chair:  Minutes stood:  Minutes ambulated:  Distance ambulated (ft): 8 ft Mobility response: Tolerated well Bed Position: Chair  Patient received in recliner chair, reported feeling better and pain has improved. Stood from chair min A with proper demonstration of NWB on RLE. Was able to hop a few feet using RW min A before requiring seated rest break at EOB. Pain a limiting factor, deferred further ambulation but requested to return to recliner chair. Stood from elevated surface at min guard and hopped back to recliner chair. Tolerated ambulation well without incident, pain level while at rest is 3/10 and while ambulating is 5-6/10. Was left with all needs met and call bell in reach.   09/01/2021 11:56 AM

## 2021-09-01 NOTE — Plan of Care (Signed)
Pt informed about interventions needed in order to be discharged, Pt informed about pain medication and appropriate situations to use.

## 2021-09-01 NOTE — Evaluation (Signed)
Occupational Therapy Evaluation Patient Details Name: Sharon Powell MRN: [id number] DOB: 01/26/1996 Today's Date: 09/01/2021   History of Present Illness Pt is a 25 y/o female s/p R knee medial patellafemoral reconstruction, R tibial tubercle osteotomy, R knee arthroscopy, and R lateral menicsus debridement. PMH includes migranes and scoliosis.   Clinical Impression   Pt educated in compensatory strategies for ADL adhering to NWB on R LE and multiple uses of 3 in 1. Pt and sister verbalizing understanding. Pt continues to have significant pain.      Recommendations for follow up therapy are one component of a multi-disciplinary discharge planning process, led by the attending physician.  Recommendations may be updated based on patient status, additional functional criteria and insurance authorization.   Follow Up Recommendations  No OT follow up    Assistance Recommended at Discharge Intermittent Supervision/Assistance  Functional Status Assessment  Patient has had a recent decline in their functional status and demonstrates the ability to make significant improvements in function in a reasonable and predictable amount of time.  Equipment Recommendations  [facility]    Recommendations for Other Services       Precautions / Restrictions Precautions Precautions: Knee;Fall Precaution Comments: no R knee flexion Required Braces or Orthoses: Other Brace Other Brace: Bledsoe brace locked in extension Restrictions Weight Bearing Restrictions: Yes RLE Weight Bearing: Non weight bearing      Mobility Bed Mobility      General bed mobility comments: received in chair    Transfers Overall transfer level: Needs assistance Equipment used: Rolling walker (2 wheels) Transfers: Sit to/from [employer] Transfers Sit to Stand: Min assist Stand pivot transfers: Min assist         General transfer comment: cues for technique and hand placement      Balance Overall balance  assessment: Needs assistance Sitting-balance support: Bilateral upper extremity supported;Feet supported (LLE on floor) Sitting balance-Leahy Scale: Fair Sitting balance - Comments: sitting EOB   Standing balance support: Bilateral upper extremity supported (RW) Standing balance-Leahy Scale: Poor Standing balance comment: reliant on UEs                           ADL either performed or assessed with clinical judgement   ADL Overall ADL's : Needs assistance/impaired Eating/Feeding: Independent;Sitting   Grooming: Set up;Sitting   Upper Body Bathing: Set up;Sitting   Lower Body Bathing: Minimal assistance;Sitting/lateral leans   Upper Body Dressing : Set up;Sitting   Lower Body Dressing: Minimal assistance;Sitting/lateral leans   Toilet Transfer: Minimal assistance;Stand-pivot;BSC;Rolling walker (2 wheels)   Toileting- Clothing Manipulation and Hygiene: Minimal assistance;Sit to/from stand               Vision Baseline Vision/History: 0 No visual deficits       Perception     Praxis      Pertinent Vitals/Pain Pain Assessment: 0-10 Pain Score: 6  Pain Location: R knee Pain Descriptors / Indicators: Constant;Grimacing;Guarding;Sharp;Stabbing Pain Intervention(s): Repositioned;Premedicated before session;Monitored during session;Ice applied;Patient requesting pain meds-RN notified     Hand Dominance Right   Extremity/Trunk Assessment Upper Extremity Assessment Upper Extremity Assessment: Overall WFL for tasks assessed   Lower Extremity Assessment Lower Extremity Assessment: Defer to PT evaluation RLE: Unable to fully assess due to pain;Unable to fully assess due to immobilization RLE Sensation: decreased light touch RLE Coordination:  (unable to formally assess due to pain/immobilization)   Cervical / Trunk Assessment Cervical / Trunk Assessment: Normal   Communication Communication Communication: No  difficulties   Cognition  Arousal/Alertness: Awake/alert Behavior During Therapy: Anxious Overall Cognitive Status: Within Functional Limits for tasks assessed                                       General Comments       Exercises     Shoulder Instructions      Home Living Family/patient expects to be discharged to:: Private residence (mom's house) Living Arrangements: Parent Available Help at Discharge: Family;Available PRN/intermittently Type of Home: House Home Access: Level entry     Home Layout: One level     Bathroom Shower/Tub: Producer, television/film/video: Standard     Home Equipment: None   Additional Comments: plans to sleep in recliner      Prior Functioning/Environment Prior Level of Function : Independent/Modified Independent                        OT Problem List: Impaired balance (sitting and/or standing);Decreased knowledge of use of DME or AE;Pain      OT Treatment/Interventions:      OT Goals(Current goals can be found in the care plan section) Acute Rehab OT Goals Patient Stated Goal: pain relief  OT Frequency:     Barriers to D/C:            Co-evaluation              AM-PAC OT "6 Clicks" Daily Activity     Outcome Measure Help from another person eating meals?: None Help from another person taking care of personal grooming?: A Little Help from another person toileting, which includes using toliet, bedpan, or urinal?: A Little Help from another person bathing (including washing, rinsing, drying)?: A Little Help from another person to put on and taking off regular upper body clothing?: A Little Help from another person to put on and taking off regular lower body clothing?: A Little 6 Click Score: 19   End of Session Equipment Utilized During Treatment: Rolling walker (2 wheels);Right knee immobilizer Nurse Communication: Patient requests pain meds  Activity Tolerance: Patient tolerated treatment well Patient left: in  chair;with call bell/phone within reach;with family/visitor present  OT Visit Diagnosis: Unsteadiness on feet (R26.81);Other abnormalities of gait and mobility (R26.89);Pain                Time:  -1310   Charges:  OT General Charges $OT Visit: 1 Visit OT Evaluation $OT Eval Moderate Complexity: 1 Mod OT Treatments $Self Care/Home Management : 8-22 mins  Martie Round, OTR/L Acute Rehabilitation Services Pager: [phone] Office: [phone]   Evern Bio 09/01/2021, 1:51 PM

## 2021-09-01 NOTE — Telephone Encounter (Signed)
Patient called. She would like [place] to call her. She has some questions. [phone]

## 2021-09-01 NOTE — TOC Transition Note (Addendum)
Transition of Care [facility]) - CM/SW Discharge Note   Patient Details  Name: Sharon Powell MRN: [id number] Date of Birth: 08/18/1996  [facility] CM/SW Contact:  Epifanio Lesches, RN Phone Number: 09/01/2021, 2:25 PM   Clinical Narrative:    Patient will DC to: home Anticipated DC date: 09/01/2021 Family notified: yes Transport by: car       - s/p Right knee MPFL reconstruction Per MD patient ready for DC today. RN, patient, and  patient's family notified of DC. Pt will d/c to mom's residence to recover short term: [address] [place], [place] [address], [phone]. Pt agreeable to home health services. Pt without preference . Referral made with [facility], acceptance pending. DME will be delivered to beside prior to d/c ( rolling walker and 3in1/bsc).  Pt without Rx med concerns. Post hospital f/u noted on AVS.  Mom to provide transportation to home.  11/4 1615 [facility] unaccepted to accept 2/2 staffing. Referral made with [facility], [facility], bot unable to accept 2/2 out of network. Referral made with [facility] , acceptance pending  RNCM will sign off for now as intervention is no longer needed. Please consult [place] again if new needs arise.    Final next level of care: Home w Home Health Services Barriers to Discharge: No Barriers Identified   Patient Goals and CMS Choice     Choice offered to / list presented to : Patient  Discharge Placement                       Discharge Plan and Services                DME Arranged: 3-N-1, Walker rolling DME Agency: [facility] Date DME Agency Contacted: 09/01/21 Time DME Agency Contacted: [phone] Representative spoke with at DME Agency: Velna Hatchet HH Arranged: PT HH Agency: [facility] Date HH Agency Contacted: 09/01/21 Time HH Agency Contacted: 1424 Representative spoke with at [facility] Agency: Pearson Grippe  Social Determinants of Health (SDOH) Interventions     Readmission Risk  Interventions No flowsheet data found.

## 2021-09-01 NOTE — Telephone Encounter (Signed)
[employer] forms received. To [employer]. 

## 2021-09-01 NOTE — Progress Notes (Signed)
   Subjective:  Patient reports pain as mild/ache, controlled with pain medication. Tolerating food although some nausea. Voiding  Objective:   VITALS:   Vitals:   08/31/21 1643 08/31/21 2032 08/31/21 2347 09/01/21 0612  BP: 113/83 121/82 110/80 115/74  Pulse: 73 85 79 79  Resp: 18  18   Temp: 98.5 F (36.9 C) 98.2 F (36.8 C) 98 F (36.7 C) 98 F (36.7 C)  TempSrc: Oral Oral Oral   SpO2: 98% 100% 100% 100%  Weight:      Height:        RLE: Dressing CDI. SILT all SP/DP/T. Fires EHL/TA/GS. Compartments soft  Lab Results  Component Value Date   WBC 9.1 08/29/2021   HGB 11.6 (L) 08/29/2021   HCT 34.8 (L) 08/29/2021   MCV 86.4 08/29/2021   PLT 291 08/29/2021     Assessment/Plan:  1 Day Post-Op   - Patient to work with PT/OT to optimize mobilization safely - DVT ppx - SCDs, ambulation, ASA 325mg  daily - Postoperative Abx: Ancef x 2 additional doses given - NWB operative extremity - Pain control - multimodal pain management, ATC acetaminophen in conjunction with as needed narcotic (oxycodone), although this should be minimized with other modalities  - Discharge today pending PT  Sharon Powell 09/01/2021, 7:27 AM

## 2021-09-01 NOTE — Discharge Summary (Signed)
Patient ID: Sharon Powell MRN: [id number] DOB/AGE: [birth year] 25 y.o.  Admit date: 08/31/2021 Discharge date: 09/01/2021  Admission Diagnoses:  <principal problem not specified>  Discharge Diagnoses:  Active Problems:   Patellar subluxation, right, initial encounter   Tear of lateral meniscus of right knee, current   Patellar instability of right knee   Past Medical History:  Diagnosis Date   Anemia    Migraines    Scoliosis     Surgeries: Procedure(s): RIGHT MEDIAL PATELLAFEMORAL RECONSTRUCTION WITH TIBIAL TUBERCLE OSTEOTOMY, RIGHT KNEE ARTHROSCOPY LATERAL MENISCAL DEBRIDEMENT ARTHROSCOPY KNEE on 08/31/2021   Consultants (if any):   Discharged Condition: Improved  Hospital Course: Sharon Powell is an 25 y.o. female who was admitted 08/31/2021 with a diagnosis of <principal problem not specified> and went to the operating room on 08/31/2021 and underwent the above named procedures.    She was given perioperative antibiotics:  Anti-infectives (From admission, onward)    Start     Dose/Rate Route Frequency Ordered Stop   08/31/21 2200  ceFAZolin (ANCEF) IVPB 1 g/50 mL premix        1 g 100 mL/hr over 30 Minutes Intravenous Every 8 hours 08/31/21 1723 09/01/21 0625   08/31/21 0900  ceFAZolin (ANCEF) IVPB 2g/100 mL premix        2 g 200 mL/hr over 30 Minutes Intravenous On call to O.R. 08/31/21 9381 08/31/21 1155     .  She was given sequential compression devices, early ambulation, and appropriate chemoprophylaxis for DVT prophylaxis.  She benefited maximally from the hospital stay and there were no complications.    Recent vital signs:  Vitals:   09/01/21 0612 09/01/21 0753  BP: 115/74 105/78  Pulse: 79 88  Resp:  16  Temp: 98 F (36.7 C) 98.2 F (36.8 C)  SpO2: 100% 100%    Recent laboratory studies:  Lab Results  Component Value Date   HGB 11.6 (L) 08/29/2021   HGB 11.2 (L) 02/15/2021   HGB 11.2 (L) 10/25/2018   Lab Results  Component Value  Date   WBC 9.1 08/29/2021   PLT 291 08/29/2021   No results found for: INR Lab Results  Component Value Date   NA 140 02/15/2021   K 3.8 02/15/2021   CL 108 02/15/2021   CO2 26 02/15/2021   BUN 10 02/15/2021   CREATININE 0.70 02/15/2021   GLUCOSE 104 (H) 02/15/2021    Discharge Medications:   Allergies as of 09/01/2021   No Known Allergies      Medication List     STOP taking these medications    BC HEADACHE POWDER PO       TAKE these medications    acetaminophen 500 MG tablet Commonly known as: TYLENOL Take 500 mg by mouth every 6 (six) hours as needed for headache.   aspirin EC 325 MG tablet Take 1 tablet (325 mg total) by mouth daily.   bacitracin ointment Apply 1 application topically 2 (two) times daily.   etonogestrel-ethinyl estradiol 0.12-0.015 MG/24HR vaginal ring Commonly known as: NUVARING Place 1 each vaginally.   ibuprofen 800 MG tablet Commonly known as: ADVIL Take 800 mg by mouth every 8 (eight) hours as needed for headache.   oxyCODONE 5 MG immediate release tablet Commonly known as: Oxy IR/ROXICODONE Take 1 tablet (5 mg total) by mouth every 4 (four) hours as needed (severe pain).   scopolamine 1 MG/3DAYS Commonly known as: TRANSDERM-SCOP Place 1 patch (1.5 mg total) onto the skin every 3 (three)  days.               Durable Medical Equipment  (From admission, onward)           Start     Ordered   09/01/21 1404  For home use only DME 3 n 1  Once        09/01/21 1403   09/01/21 1238  For home use only DME Walker rolling  Once       Question Answer Comment  Walker: With 5 Inch Wheels   Patient needs a walker to treat with the following condition Fx      09/01/21 1239            Diagnostic Studies: DG Knee Complete 4 Views Right  Result Date: 08/31/2021 CLINICAL DATA:  Right patellar reconstruction EXAM: RIGHT KNEE - COMPLETE 4+ VIEW COMPARISON:  None. FINDINGS: A total of 6 fluoroscopic spot films taken during  intraoperative knee surgery are submitted for review. Images demonstrate multiple projections of the knee joint. There is normal alignment. Images demonstrate 3 total screws at the level of the tibial tuberosity. Overlying surgical drains and gauze are present. IMPRESSION: Intraoperative films as described. Refer to operative report for full details. Electronically Signed   By: Olive Bass M.D.   On: 08/31/2021 15:03   DG C-Arm 1-60 Min-No Report  Result Date: 08/31/2021 Fluoroscopy was utilized by the requesting physician.  No radiographic interpretation.   DG C-Arm 1-60 Min-No Report  Result Date: 08/31/2021 Fluoroscopy was utilized by the requesting physician.  No radiographic interpretation.    Disposition: Discharge disposition: 01-Home or Self Care          Follow-up Information     [name], MD Follow up.   Specialty: Orthopedic Surgery Contact information: [address] [place] [address] [phone]                  Signed: [name] 09/01/2021, 3:48 PM

## 2021-09-01 NOTE — Evaluation (Signed)
Physical Therapy Evaluation Patient Details Name: Sharon Powell MRN: [id number] DOB: 28-Jun-1996 Today's Date: 09/01/2021  History of Present Illness  Pt is a 25 y/o female s/p R knee medial patellafemoral reconstruction, R tibial tubercle osteotomy, R knee arthroscopy, and R lateral menicsus debridement. PMH includes migranes and scoliosis.  Clinical Impression  Pt limited by pain during session but motivated to return home. Pt performed bed mobility with mod A overall and transferred into recliner with min/mod A with RW. Pt able to recall precautions (NWB RLE and no knee flexion) without cues and demonstrated good adherence to precautions with mobility. Pt unable to "hop" on LLE, pivoting on LLE to get into recliner. Acute PT to cont to follow.      Recommendations for follow up therapy are one component of a multi-disciplinary discharge planning process, led by the attending physician.  Recommendations may be updated based on patient status, additional functional criteria and insurance authorization.  Follow Up Recommendations Home health PT    Assistance Recommended at Discharge Intermittent Supervision/Assistance  Functional Status Assessment Patient has had a recent decline in their functional status and demonstrates the ability to make significant improvements in function in a reasonable and predictable amount of time.  Equipment Recommendations  Rolling walker (2 wheels)    Recommendations for Other Services OT consult     Precautions / Restrictions Precautions Precautions: Knee Precaution Comments: no R knee flexion Required Braces or Orthoses: Other Brace Other Brace: Bledsoe brace locked in extension Restrictions Weight Bearing Restrictions: Yes RLE Weight Bearing: Non weight bearing      Mobility  Bed Mobility Overal bed mobility: Needs Assistance Bed Mobility: Rolling;Supine to Sit Rolling: Min assist   Supine to sit: Mod assist;HOB elevated     General bed  mobility comments: mod A for RLE management - pt extremly limited by pain    Transfers Overall transfer level: Needs assistance Equipment used: Rolling walker (2 wheels) Transfers: Sit to/from [employer] Sit to Stand: Mod assist Stand pivot transfers: Min assist         General transfer comment: from elevated EOB. unable to "hop" on LLE but pivoted and demonstrated good adherance to RLE NWB precautions.    Ambulation/Gait                Stairs            Wheelchair Mobility    Modified Rankin (Stroke Patients Only)       Balance Overall balance assessment: Needs assistance Sitting-balance support: Bilateral upper extremity supported;Feet supported (LLE on floor) Sitting balance-Leahy Scale: Fair Sitting balance - Comments: sitting EOB   Standing balance support: Bilateral upper extremity supported (RW) Standing balance-Leahy Scale: Poor Standing balance comment: transferred bed<>recliner                             Pertinent Vitals/Pain Pain Assessment: 0-10 Pain Score: 8  Pain Location: R knee Pain Descriptors / Indicators: Constant;Grimacing;Guarding;Sharp;Stabbing Pain Intervention(s): Limited activity within patient's tolerance;Monitored during session;Premedicated before session;Relaxation;Repositioned;Patient requesting pain meds-RN notified    Home Living Family/patient expects to be discharged to:: Private residence (mom's house) Living Arrangements: Parent Available Help at Discharge: Family;Available PRN/intermittently (mom works part time) Type of Home: House Home Access: Level entry       Home Layout: One level Home Equipment: None Additional Comments: plans to sleep in recliner    Prior Function Prior Level of Function : Independent/Modified Independent  Mobility Comments: wants to work on stairs to be able to get up flight of steps to return to her apartment       Hand Dominance    Dominant Hand: Right    Extremity/Trunk Assessment        Lower Extremity Assessment Lower Extremity Assessment: RLE deficits/detail RLE: Unable to fully assess due to pain;Unable to fully assess due to immobilization RLE Sensation: decreased light touch RLE Coordination:  (unable to formally assess due to pain/immobilization)    Cervical / Trunk Assessment Cervical / Trunk Assessment: Normal  Communication   Communication: No difficulties  Cognition Arousal/Alertness: Awake/alert Behavior During Therapy: WFL for tasks assessed/performed Overall Cognitive Status: Within Functional Limits for tasks assessed                                          General Comments      Exercises     Assessment/Plan    PT Assessment Patient needs continued PT services  PT Problem List Decreased strength;Decreased range of motion;Decreased activity tolerance;Decreased balance;Decreased mobility;Decreased coordination;Impaired sensation;Decreased skin integrity;Pain       PT Treatment Interventions DME instruction;Gait training;Stair training;Functional mobility training;Therapeutic activities;Therapeutic exercise;Balance training;Neuromuscular re-education;Patient/family education;Wheelchair mobility training;Manual techniques;Modalities    PT Goals (Current goals can be found in the Care Plan section)  Acute Rehab PT Goals Patient Stated Goal: to go home PT Goal Formulation: With patient Time For Goal Achievement: 09/08/21 Potential to Achieve Goals: Good    Frequency Min 5X/week   Barriers to discharge Decreased caregiver support mom works part time    Co-evaluation               AM-PAC PT "6 Clicks" Mobility  Outcome Measure Help needed turning from your back to your side while in a flat bed without using bedrails?: A Little Help needed moving from lying on your back to sitting on the side of a flat bed without using bedrails?: A Lot Help needed moving  to and from a bed to a chair (including a wheelchair)?: A Little Help needed standing up from a chair using your arms (e.g., wheelchair or bedside chair)?: A Lot Help needed to walk in hospital room?: Total Help needed climbing 3-5 steps with a railing? : Total 6 Click Score: 12    End of Session Equipment Utilized During Treatment: Gait belt;Other (comment) (R knee Bledsoe brace locked in extension) Activity Tolerance: Patient limited by pain Patient left: in chair;with call bell/phone within reach Nurse Communication: Mobility status PT Visit Diagnosis: Unsteadiness on feet (R26.81);Muscle weakness (generalized) (M62.81);Other abnormalities of gait and mobility (R26.89);Pain Pain - Right/Left: Right Pain - part of body: Knee    Time: 5621-3086 PT Time Calculation (min) (ACUTE ONLY): 31 min   Charges:   PT Evaluation $PT Eval Low Complexity: 1 Low PT Treatments $Therapeutic Activity: 8-22 mins        Raechel Chute PT, DPT   Alfonso Patten 09/01/2021, 11:42 AM

## 2021-09-04 ENCOUNTER — Telehealth (HOSPITAL_BASED_OUTPATIENT_CLINIC_OR_DEPARTMENT_OTHER): Payer: Self-pay | Admitting: Orthopaedic Surgery

## 2021-09-04 NOTE — Telephone Encounter (Signed)
Returned call to patient, but mailbox was full

## 2021-09-04 NOTE — Telephone Encounter (Signed)
Spoke to patient directly 

## 2021-09-05 ENCOUNTER — Other Ambulatory Visit: Payer: Self-pay

## 2021-09-05 ENCOUNTER — Telehealth: Payer: Self-pay | Admitting: Orthopaedic Surgery

## 2021-09-05 ENCOUNTER — Ambulatory Visit (HOSPITAL_BASED_OUTPATIENT_CLINIC_OR_DEPARTMENT_OTHER): Payer: 59 | Attending: Orthopaedic Surgery | Admitting: Physical Therapy

## 2021-09-05 ENCOUNTER — Encounter (HOSPITAL_BASED_OUTPATIENT_CLINIC_OR_DEPARTMENT_OTHER): Payer: Self-pay | Admitting: Physical Therapy

## 2021-09-05 DIAGNOSIS — M25561 Pain in right knee: Secondary | ICD-10-CM | POA: Insufficient documentation

## 2021-09-05 DIAGNOSIS — M6281 Muscle weakness (generalized): Secondary | ICD-10-CM | POA: Insufficient documentation

## 2021-09-05 DIAGNOSIS — R600 Localized edema: Secondary | ICD-10-CM | POA: Diagnosis present

## 2021-09-05 DIAGNOSIS — S83001A Unspecified subluxation of right patella, initial encounter: Secondary | ICD-10-CM | POA: Insufficient documentation

## 2021-09-05 DIAGNOSIS — M25661 Stiffness of right knee, not elsewhere classified: Secondary | ICD-10-CM | POA: Insufficient documentation

## 2021-09-05 DIAGNOSIS — R262 Difficulty in walking, not elsewhere classified: Secondary | ICD-10-CM | POA: Diagnosis present
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
## 2021-09-05 NOTE — Telephone Encounter (Signed)
STD form [employer] form received (via email from [place]). To [employer].

## 2021-09-08 ENCOUNTER — Other Ambulatory Visit: Payer: Self-pay

## 2021-09-08 ENCOUNTER — Ambulatory Visit (HOSPITAL_BASED_OUTPATIENT_CLINIC_OR_DEPARTMENT_OTHER): Payer: 59 | Attending: Orthopaedic Surgery | Admitting: Physical Therapy

## 2021-09-08 ENCOUNTER — Encounter (HOSPITAL_BASED_OUTPATIENT_CLINIC_OR_DEPARTMENT_OTHER): Payer: Self-pay | Admitting: Physical Therapy

## 2021-09-08 DIAGNOSIS — R600 Localized edema: Secondary | ICD-10-CM | POA: Diagnosis present

## 2021-09-08 DIAGNOSIS — R262 Difficulty in walking, not elsewhere classified: Secondary | ICD-10-CM | POA: Diagnosis present

## 2021-09-08 DIAGNOSIS — M6281 Muscle weakness (generalized): Secondary | ICD-10-CM | POA: Insufficient documentation

## 2021-09-08 DIAGNOSIS — M25661 Stiffness of right knee, not elsewhere classified: Secondary | ICD-10-CM | POA: Insufficient documentation

## 2021-09-08 DIAGNOSIS — M25561 Pain in right knee: Secondary | ICD-10-CM | POA: Diagnosis present
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
## 2021-09-12 ENCOUNTER — Encounter (HOSPITAL_BASED_OUTPATIENT_CLINIC_OR_DEPARTMENT_OTHER): Payer: Self-pay | Admitting: Physical Therapy

## 2021-09-12 ENCOUNTER — Other Ambulatory Visit: Payer: Self-pay

## 2021-09-12 ENCOUNTER — Ambulatory Visit (HOSPITAL_BASED_OUTPATIENT_CLINIC_OR_DEPARTMENT_OTHER): Payer: 59 | Admitting: Physical Therapy

## 2021-09-12 DIAGNOSIS — R600 Localized edema: Secondary | ICD-10-CM

## 2021-09-12 DIAGNOSIS — R262 Difficulty in walking, not elsewhere classified: Secondary | ICD-10-CM

## 2021-09-12 DIAGNOSIS — M6281 Muscle weakness (generalized): Secondary | ICD-10-CM

## 2021-09-12 DIAGNOSIS — M25661 Stiffness of right knee, not elsewhere classified: Secondary | ICD-10-CM

## 2021-09-12 DIAGNOSIS — M25561 Pain in right knee: Secondary | ICD-10-CM
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
## 2021-09-14 ENCOUNTER — Other Ambulatory Visit (HOSPITAL_BASED_OUTPATIENT_CLINIC_OR_DEPARTMENT_OTHER): Payer: Self-pay

## 2021-09-14 ENCOUNTER — Ambulatory Visit (INDEPENDENT_AMBULATORY_CARE_PROVIDER_SITE_OTHER): Payer: 59 | Admitting: Orthopaedic Surgery

## 2021-09-14 ENCOUNTER — Other Ambulatory Visit: Payer: Self-pay

## 2021-09-14 ENCOUNTER — Ambulatory Visit (HOSPITAL_BASED_OUTPATIENT_CLINIC_OR_DEPARTMENT_OTHER)
Admission: RE | Admit: 2021-09-14 | Discharge: 2021-09-14 | Disposition: A | Discharge status: Home / Self Care | Payer: 59 | Source: Ambulatory Visit | Attending: Orthopaedic Surgery | Admitting: Orthopaedic Surgery

## 2021-09-14 ENCOUNTER — Telehealth: Payer: Self-pay | Admitting: Orthopaedic Surgery

## 2021-09-14 ENCOUNTER — Other Ambulatory Visit (HOSPITAL_BASED_OUTPATIENT_CLINIC_OR_DEPARTMENT_OTHER): Payer: Self-pay | Admitting: Orthopaedic Surgery

## 2021-09-14 DIAGNOSIS — M25361 Other instability, right knee: Secondary | ICD-10-CM | POA: Insufficient documentation

## 2021-09-14 DIAGNOSIS — S83001A Unspecified subluxation of right patella, initial encounter: Secondary | ICD-10-CM

## 2021-09-14 MED ORDER — OXYCODONE HCL 5 MG PO TABS: 5.0000 mg | ORAL_TABLET | ORAL | 0 refills | Status: DC | PRN

## 2021-09-14 MED ORDER — OXYCODONE HCL 5 MG PO TABS
5.0000 mg | ORAL_TABLET | Freq: Four times a day (QID) | ORAL | Status: DC | PRN
Start: 2021-09-14 — End: 2021-09-14

## 2021-09-14 MED ORDER — OXYCODONE HCL 5 MG PO TABS
5.0000 mg | ORAL_TABLET | ORAL | Status: DC | PRN
Start: 2021-09-14 — End: 2021-09-14

## 2021-09-14 MED ORDER — OXYCODONE HCL 5 MG PO TABS
5.0000 mg | ORAL_TABLET | ORAL | 0 refills | Status: DC | PRN
  Filled 2021-09-14: qty 20, 4d supply, fill #0

## 2021-09-14 NOTE — Telephone Encounter (Signed)
Pt called stating her oxycodone medication was sent to wrong pharmacy. It needs to go to [facility]. Pt is [facility] now and asking for a call back right away. Please call pt at [phone].

## 2021-09-14 NOTE — Addendum Note (Signed)
Addended by: Benancio Deeds on: 09/14/2021 04:39 PM   Modules accepted: Orders

## 2021-09-14 NOTE — Progress Notes (Signed)
Post Operative Evaluation    Procedure/Date of Surgery: 08/31/21 right knee MPFL reconstruction and TTO  Interval History:  Presents today 2-week status post the above procedure.  Overall she is doing well.  She has been compliant with aspirin.  She has been taking oxycodone as well as Tylenol and ibuprofen.  Overall she does feel some pressure in the knee which is limiting her ability to work with physical therapy.  PMH/PSH/Family History/Social History/Meds/Allergies:    Past Medical History:  Diagnosis Date   Anemia    Migraines    Scoliosis    Past Surgical History:  Procedure Laterality Date   CYST REMOVAL LEG     KNEE ARTHROSCOPY Right 08/31/2021   Procedure: ARTHROSCOPY KNEE;  Surgeon: [name], MD;  Location: [facility] OR;  Service: Orthopedics;  Laterality: Right;   MEDIAL PATELLOFEMORAL LIGAMENT REPAIR Right 08/31/2021   Procedure: RIGHT MEDIAL PATELLAFEMORAL RECONSTRUCTION WITH TIBIAL TUBERCLE OSTEOTOMY, RIGHT KNEE ARTHROSCOPY LATERAL MENISCAL DEBRIDEMENT;  Surgeon: [name], MD;  Location: [facility] OR;  Service: Orthopedics;  Laterality: Right;   Social History   Socioeconomic History   Marital status: Single    Spouse name: Not on file   Number of children: Not on file   Years of education: Not on file   Highest education level: Not on file  Occupational History   Occupation: [employer]  Tobacco Use   Smoking status: Never   Smokeless tobacco: Never  Vaping Use   Vaping Use: Never used  Substance and Sexual Activity   Alcohol use: Not Currently   Drug use: No   Sexual activity: Yes    Birth control/protection: Pill, Implant  Other Topics Concern   Not on file  Social History Narrative   Not on file   Social Determinants of Health   Financial Resource Strain: Not on file  Food Insecurity: Not on file  Transportation Needs: Not on file  Physical Activity: Not on file  Stress: Not on file  Social Connections: Not on file    Family History  Problem Relation Age of Onset   Diabetes Mother    No Known Allergies Current Outpatient Medications  Medication Sig Dispense Refill   acetaminophen (TYLENOL) 500 MG tablet Take 500 mg by mouth every 6 (six) hours as needed for headache.     aspirin EC 325 MG tablet Take 1 tablet (325 mg total) by mouth daily. (Patient not taking: No sig reported) 30 tablet 0   bacitracin ointment Apply 1 application topically 2 (two) times daily. (Patient not taking: No sig reported) 120 g 0   etonogestrel-ethinyl estradiol (NUVARING) 0.12-0.015 MG/24HR vaginal ring Place 1 each vaginally.     ibuprofen (ADVIL) 800 MG tablet Take 800 mg by mouth every 8 (eight) hours as needed for headache.     oxyCODONE (OXY IR/ROXICODONE) 5 MG immediate release tablet Take 1 tablet (5 mg total) by mouth every 4 (four) hours as needed (severe pain). 20 tablet 0   scopolamine (TRANSDERM-SCOP) 1 MG/3DAYS Place 1 patch (1.5 mg total) onto the skin every 3 (three) days. 10 patch 12   Current Facility-Administered Medications  Medication Dose Route Frequency Provider Last Rate Last Admin   oxyCODONE (Oxy IR/ROXICODONE) immediate release tablet 5 mg  5 mg Oral Q4H PRN [name], MD       No  results found.  Review of Systems:   A ROS was performed including pertinent positives and negatives as documented in the HPI.   Musculoskeletal Exam:    Last menstrual period 08/16/2021.  Right knee incisions are well-healing.  Patella is well centralized.  Trace effusion about the knee.  She has guarding about the quad but overall with coaching she is able to get to approximately 45 degrees of flexion without difficulty Imaging:   X-ray right knee 3 views Status post tibial tubercle osteotomy without evidence of complication  I personally reviewed and interpreted the radiographs.   Assessment:   25 year old female 2-week status post right MPFL reconstruction with tibial tubercle osteotomy overall doing  well.  At this time she can advance her weightbearing to partial weightbearing.  She can advance to full weightbearing by 4 weeks.  Her goal is to get to 90 degrees x 4 weeks.  She will continue to work with physical therapy to achieve this.  Plan :    -Partial weightbearing for additional 2 weeks and may advance to full after that time. -Return to clinic in 2 weeks    I personally saw and evaluated the patient, and participated in the management and treatment plan.  [name], MD Attending Physician, Orthopedic Surgery  This document was dictated using Dragon voice recognition software. A reasonable attempt at proof reading has been made to minimize errors.

## 2021-09-15 ENCOUNTER — Other Ambulatory Visit (HOSPITAL_BASED_OUTPATIENT_CLINIC_OR_DEPARTMENT_OTHER): Payer: Self-pay

## 2021-09-15 ENCOUNTER — Ambulatory Visit (HOSPITAL_BASED_OUTPATIENT_CLINIC_OR_DEPARTMENT_OTHER): Payer: 59 | Admitting: Physical Therapy

## 2021-09-15 DIAGNOSIS — R262 Difficulty in walking, not elsewhere classified: Secondary | ICD-10-CM

## 2021-09-15 DIAGNOSIS — M25561 Pain in right knee: Secondary | ICD-10-CM | POA: Diagnosis not present

## 2021-09-15 DIAGNOSIS — M25661 Stiffness of right knee, not elsewhere classified: Secondary | ICD-10-CM

## 2021-09-15 DIAGNOSIS — R600 Localized edema: Secondary | ICD-10-CM

## 2021-09-15 DIAGNOSIS — M6281 Muscle weakness (generalized): Secondary | ICD-10-CM
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
## 2021-09-17 ENCOUNTER — Encounter (HOSPITAL_BASED_OUTPATIENT_CLINIC_OR_DEPARTMENT_OTHER): Payer: Self-pay | Admitting: Physical Therapy

## 2021-09-17 ENCOUNTER — Other Ambulatory Visit: Payer: Self-pay

## 2021-09-20 ENCOUNTER — Ambulatory Visit (HOSPITAL_BASED_OUTPATIENT_CLINIC_OR_DEPARTMENT_OTHER): Payer: 59 | Admitting: Physical Therapy

## 2021-09-20 ENCOUNTER — Encounter (HOSPITAL_BASED_OUTPATIENT_CLINIC_OR_DEPARTMENT_OTHER): Payer: Self-pay | Admitting: Physical Therapy

## 2021-09-20 ENCOUNTER — Other Ambulatory Visit: Payer: Self-pay

## 2021-09-20 DIAGNOSIS — M25561 Pain in right knee: Secondary | ICD-10-CM

## 2021-09-20 DIAGNOSIS — R600 Localized edema: Secondary | ICD-10-CM

## 2021-09-20 DIAGNOSIS — R262 Difficulty in walking, not elsewhere classified: Secondary | ICD-10-CM

## 2021-09-20 DIAGNOSIS — M25661 Stiffness of right knee, not elsewhere classified: Secondary | ICD-10-CM

## 2021-09-20 DIAGNOSIS — M6281 Muscle weakness (generalized): Secondary | ICD-10-CM
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
## 2021-09-27 ENCOUNTER — Ambulatory Visit (HOSPITAL_BASED_OUTPATIENT_CLINIC_OR_DEPARTMENT_OTHER): Payer: 59 | Admitting: Physical Therapy

## 2021-09-27 ENCOUNTER — Other Ambulatory Visit: Payer: Self-pay

## 2021-09-27 ENCOUNTER — Encounter (HOSPITAL_BASED_OUTPATIENT_CLINIC_OR_DEPARTMENT_OTHER): Payer: Self-pay | Admitting: Physical Therapy

## 2021-09-27 DIAGNOSIS — M25661 Stiffness of right knee, not elsewhere classified: Secondary | ICD-10-CM

## 2021-09-27 DIAGNOSIS — R262 Difficulty in walking, not elsewhere classified: Secondary | ICD-10-CM

## 2021-09-27 DIAGNOSIS — M25561 Pain in right knee: Secondary | ICD-10-CM | POA: Diagnosis not present

## 2021-09-27 DIAGNOSIS — M6281 Muscle weakness (generalized): Secondary | ICD-10-CM

## 2021-09-27 DIAGNOSIS — R600 Localized edema: Secondary | ICD-10-CM
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
## 2021-09-29 ENCOUNTER — Ambulatory Visit (HOSPITAL_BASED_OUTPATIENT_CLINIC_OR_DEPARTMENT_OTHER): Payer: 59 | Attending: Orthopaedic Surgery | Admitting: Physical Therapy

## 2021-09-29 ENCOUNTER — Other Ambulatory Visit: Payer: Self-pay

## 2021-09-29 ENCOUNTER — Other Ambulatory Visit (HOSPITAL_BASED_OUTPATIENT_CLINIC_OR_DEPARTMENT_OTHER): Payer: Self-pay | Admitting: Orthopaedic Surgery

## 2021-09-29 ENCOUNTER — Ambulatory Visit (INDEPENDENT_AMBULATORY_CARE_PROVIDER_SITE_OTHER): Payer: 59 | Admitting: Orthopaedic Surgery

## 2021-09-29 ENCOUNTER — Ambulatory Visit (HOSPITAL_BASED_OUTPATIENT_CLINIC_OR_DEPARTMENT_OTHER)
Admission: RE | Admit: 2021-09-29 | Discharge: 2021-09-29 | Disposition: A | Discharge status: Home / Self Care | Payer: 59 | Source: Ambulatory Visit | Attending: Orthopaedic Surgery | Admitting: Orthopaedic Surgery

## 2021-09-29 DIAGNOSIS — S83281D Other tear of lateral meniscus, current injury, right knee, subsequent encounter: Secondary | ICD-10-CM

## 2021-09-29 DIAGNOSIS — M25361 Other instability, right knee: Secondary | ICD-10-CM

## 2021-09-29 DIAGNOSIS — R600 Localized edema: Secondary | ICD-10-CM

## 2021-09-29 DIAGNOSIS — M25561 Pain in right knee: Secondary | ICD-10-CM

## 2021-09-29 DIAGNOSIS — M25661 Stiffness of right knee, not elsewhere classified: Secondary | ICD-10-CM

## 2021-09-29 DIAGNOSIS — S83001A Unspecified subluxation of right patella, initial encounter: Secondary | ICD-10-CM

## 2021-09-29 DIAGNOSIS — M6281 Muscle weakness (generalized): Secondary | ICD-10-CM

## 2021-09-29 DIAGNOSIS — R262 Difficulty in walking, not elsewhere classified: Secondary | ICD-10-CM

## 2021-09-29 MED ORDER — LIDOCAINE HCL 1 % IJ SOLN
4.0000 mL | INTRAMUSCULAR | Status: AC | PRN
Start: 2021-09-29 — End: 2021-09-29
  Administered 2021-09-29: 4 mL

## 2021-09-29 MED ORDER — TRIAMCINOLONE ACETONIDE 40 MG/ML IJ SUSP
80.0000 mg | INTRAMUSCULAR | Status: AC | PRN
  Administered 2021-09-29: 80 mg via INTRA_ARTICULAR

## 2021-09-29 NOTE — Progress Notes (Signed)
Post Operative Evaluation    Procedure/Date of Surgery: 08/31/21 right knee MPFL reconstruction and TTO  Interval History:   09/29/2021: Presents today for follow-up of the above procedure.  She is working with physical therapy although continues to have pain and swelling about the knee.  She endorses effusion about the knee.  Endorses some medial tenderness about the graft site.   PMH/PSH/Family History/Social History/Meds/Allergies:    Past Medical History:  Diagnosis Date  . Anemia   . Migraines   . Scoliosis    Past Surgical History:  Procedure Laterality Date  . CYST REMOVAL LEG    . KNEE ARTHROSCOPY Right 08/31/2021   Procedure: ARTHROSCOPY KNEE;  Surgeon: [name], MD;  Location: [facility] OR;  Service: Orthopedics;  Laterality: Right;  . MEDIAL PATELLOFEMORAL LIGAMENT REPAIR Right 08/31/2021   Procedure: RIGHT MEDIAL PATELLAFEMORAL RECONSTRUCTION WITH TIBIAL TUBERCLE OSTEOTOMY, RIGHT KNEE ARTHROSCOPY LATERAL MENISCAL DEBRIDEMENT;  Surgeon: [name], MD;  Location: [facility] OR;  Service: Orthopedics;  Laterality: Right;   Social History   Socioeconomic History  . Marital status: Single    Spouse name: Not on file  . Number of children: Not on file  . Years of education: Not on file  . Highest education level: Not on file  Occupational History  . Occupation: [employer]  Tobacco Use  . Smoking status: Never  . Smokeless tobacco: Never  Vaping Use  . Vaping Use: Never used  Substance and Sexual Activity  . Alcohol use: Not Currently  . Drug use: No  . Sexual activity: Yes    Birth control/protection: Pill, Implant  Other Topics Concern  . Not on file  Social History Narrative  . Not on file   Social Determinants of Health   Financial Resource Strain: Not on file  Food Insecurity: Not on file  Transportation Needs: Not on file  Physical Activity: Not on file  Stress: Not on file  Social Connections: Not on file   Family  History  Problem Relation Age of Onset  . Diabetes Mother    No Known Allergies Current Outpatient Medications  Medication Sig Dispense Refill  . acetaminophen (TYLENOL) 500 MG tablet Take 500 mg by mouth every 6 (six) hours as needed for headache.    Marland Kitchen aspirin EC 325 MG tablet Take 1 tablet (325 mg total) by mouth daily. (Patient not taking: No sig reported) 30 tablet 0  . bacitracin ointment Apply 1 application topically 2 (two) times daily. (Patient not taking: No sig reported) 120 g 0  . etonogestrel-ethinyl estradiol (NUVARING) 0.12-0.015 MG/24HR vaginal ring Place 1 each vaginally.    Marland Kitchen ibuprofen (ADVIL) 800 MG tablet Take 800 mg by mouth every 8 (eight) hours as needed for headache.    . oxyCODONE (OXY IR/ROXICODONE) 5 MG immediate release tablet Take 1 tablet (5 mg total) by mouth every 4 (four) hours as needed (severe pain). 20 tablet 0  . scopolamine (TRANSDERM-SCOP) 1 MG/3DAYS Place 1 patch (1.5 mg total) onto the skin every 3 (three) days. 10 patch 12   No current facility-administered medications for this visit.   No results found.  Review of Systems:   A ROS was performed including pertinent positives and negatives as documented in the HPI.   Musculoskeletal Exam:    There were no vitals taken for this visit.  Right knee incisions are well-healing.  Patella is well centralized.  Trace effusion about the knee.  She has guarding about the quad but overall with coaching she is able to get to approximately 45 degrees of flexion without difficulty Imaging:   X-ray right knee 3 views Status post tibial tubercle osteotomy without evidence of complication  I personally reviewed and interpreted the radiographs.   Assessment:   25 year old female 2-week status post right MPFL reconstruction with tibial tubercle osteotomy overall doing well.  In order to help her out with range of motion I would recommend that today we will perform an aspiration of the knee and inject steroid  as well as lidocaine.  I will plan to talk with physical therapy and see if we can work her in for a brief p.m. session to help mobilize the knee Plan :    -Begin advancing to full weightbearing at this time -Return to clinic in 2 weeks    Procedure Note  Patient: Sharon Powell             Date of Birth: 1996/09/12           MRN: [id number]             Visit Date: 09/29/2021  Procedures: Visit Diagnoses: No diagnosis found.  Large Joint Inj: R knee on 09/29/2021 10:28 AM Indications: pain Details: 22 G 1.5 in needle, ultrasound-guided anterior approach  Arthrogram: No  Medications: 4 mL lidocaine 1 %; 80 mg triamcinolone acetonide 40 MG/ML Outcome: tolerated well, no immediate complications Procedure, treatment alternatives, risks and benefits explained, specific risks discussed. Consent was given by the patient. Immediately prior to procedure a time out was called to verify the correct patient, procedure, equipment, support staff and site/side marked as required. Patient was prepped and draped in the usual sterile fashion.       I personally saw and evaluated the patient, and participated in the management and treatment plan.  [name], MD Attending Physician, Orthopedic Surgery  This document was dictated using Dragon voice recognition software. A reasonable attempt at proof reading has been made to minimize errors.
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
## 2021-10-02 ENCOUNTER — Ambulatory Visit (HOSPITAL_BASED_OUTPATIENT_CLINIC_OR_DEPARTMENT_OTHER): Payer: 59 | Admitting: Physical Therapy
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
## 2021-10-05 ENCOUNTER — Ambulatory Visit (HOSPITAL_BASED_OUTPATIENT_CLINIC_OR_DEPARTMENT_OTHER): Payer: 59 | Admitting: Physical Therapy

## 2021-10-05 ENCOUNTER — Encounter (HOSPITAL_BASED_OUTPATIENT_CLINIC_OR_DEPARTMENT_OTHER): Payer: Self-pay | Admitting: Physical Therapy

## 2021-10-05 ENCOUNTER — Other Ambulatory Visit: Payer: Self-pay

## 2021-10-05 DIAGNOSIS — M6281 Muscle weakness (generalized): Secondary | ICD-10-CM

## 2021-10-05 DIAGNOSIS — R600 Localized edema: Secondary | ICD-10-CM | POA: Diagnosis present

## 2021-10-05 DIAGNOSIS — M25561 Pain in right knee: Secondary | ICD-10-CM | POA: Diagnosis present

## 2021-10-05 DIAGNOSIS — R262 Difficulty in walking, not elsewhere classified: Secondary | ICD-10-CM

## 2021-10-05 DIAGNOSIS — M25661 Stiffness of right knee, not elsewhere classified: Secondary | ICD-10-CM
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
## 2021-10-11 ENCOUNTER — Ambulatory Visit (HOSPITAL_BASED_OUTPATIENT_CLINIC_OR_DEPARTMENT_OTHER): Payer: 59 | Admitting: Physical Therapy

## 2021-10-11 ENCOUNTER — Encounter (HOSPITAL_BASED_OUTPATIENT_CLINIC_OR_DEPARTMENT_OTHER): Payer: Self-pay | Admitting: Physical Therapy

## 2021-10-11 ENCOUNTER — Other Ambulatory Visit: Payer: Self-pay

## 2021-10-11 DIAGNOSIS — M6281 Muscle weakness (generalized): Secondary | ICD-10-CM

## 2021-10-11 DIAGNOSIS — M25561 Pain in right knee: Secondary | ICD-10-CM

## 2021-10-11 DIAGNOSIS — R262 Difficulty in walking, not elsewhere classified: Secondary | ICD-10-CM

## 2021-10-11 DIAGNOSIS — M25661 Stiffness of right knee, not elsewhere classified: Secondary | ICD-10-CM
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
## 2021-10-13 ENCOUNTER — Other Ambulatory Visit (HOSPITAL_BASED_OUTPATIENT_CLINIC_OR_DEPARTMENT_OTHER): Payer: Self-pay | Admitting: Orthopaedic Surgery

## 2021-10-13 ENCOUNTER — Encounter (HOSPITAL_BASED_OUTPATIENT_CLINIC_OR_DEPARTMENT_OTHER): Payer: Self-pay | Admitting: Physical Therapy

## 2021-10-13 ENCOUNTER — Ambulatory Visit (INDEPENDENT_AMBULATORY_CARE_PROVIDER_SITE_OTHER): Payer: 59 | Admitting: Orthopaedic Surgery

## 2021-10-13 ENCOUNTER — Other Ambulatory Visit: Payer: Self-pay

## 2021-10-13 ENCOUNTER — Ambulatory Visit (HOSPITAL_BASED_OUTPATIENT_CLINIC_OR_DEPARTMENT_OTHER)
Admission: RE | Admit: 2021-10-13 | Discharge: 2021-10-13 | Disposition: A | Discharge status: Home / Self Care | Payer: 59 | Source: Ambulatory Visit | Attending: Orthopaedic Surgery | Admitting: Orthopaedic Surgery

## 2021-10-13 ENCOUNTER — Ambulatory Visit (HOSPITAL_BASED_OUTPATIENT_CLINIC_OR_DEPARTMENT_OTHER): Payer: 59 | Admitting: Physical Therapy

## 2021-10-13 DIAGNOSIS — R262 Difficulty in walking, not elsewhere classified: Secondary | ICD-10-CM

## 2021-10-13 DIAGNOSIS — M25361 Other instability, right knee: Secondary | ICD-10-CM | POA: Insufficient documentation

## 2021-10-13 DIAGNOSIS — M25661 Stiffness of right knee, not elsewhere classified: Secondary | ICD-10-CM

## 2021-10-13 DIAGNOSIS — M6281 Muscle weakness (generalized): Secondary | ICD-10-CM

## 2021-10-13 DIAGNOSIS — M25561 Pain in right knee: Secondary | ICD-10-CM

## 2021-10-13 DIAGNOSIS — S83001A Unspecified subluxation of right patella, initial encounter: Secondary | ICD-10-CM

## 2021-10-13 NOTE — Progress Notes (Signed)
Post Operative Evaluation    Procedure/Date of Surgery: 08/31/21 right knee MPFL reconstruction and TTO  Interval History:   10/13/2021: Presents today for follow-up of the above procedure.  She continues to make slow and steady improvements.  She was injected at her last visit which has significantly helped with the swelling at this time.  Her pain is improving but occasionally painful with therapy   PMH/PSH/Family History/Social History/Meds/Allergies:    Past Medical History:  Diagnosis Date   Anemia    Migraines    Scoliosis    Past Surgical History:  Procedure Laterality Date   CYST REMOVAL LEG     KNEE ARTHROSCOPY Right 08/31/2021   Procedure: ARTHROSCOPY KNEE;  Surgeon: [name], MD;  Location: [facility];  Service: Orthopedics;  Laterality: Right;   MEDIAL PATELLOFEMORAL LIGAMENT REPAIR Right 08/31/2021   Procedure: RIGHT MEDIAL PATELLAFEMORAL RECONSTRUCTION WITH TIBIAL TUBERCLE OSTEOTOMY, RIGHT KNEE ARTHROSCOPY LATERAL MENISCAL DEBRIDEMENT;  Surgeon: [name], MD;  Location: [facility];  Service: Orthopedics;  Laterality: Right;   Social History   Socioeconomic History   Marital status: Single    Spouse name: Not on file   Number of children: Not on file   Years of education: Not on file   Highest education level: Not on file  Occupational History   Occupation: [employer]  Tobacco Use   Smoking status: Never   Smokeless tobacco: Never  Vaping Use   Vaping Use: Never used  Substance and Sexual Activity   Alcohol use: Not Currently   Drug use: No   Sexual activity: Yes    Birth control/protection: Pill, Implant  Other Topics Concern   Not on file  Social History Narrative   Not on file   Social Determinants of Health   Financial Resource Strain: Not on file  Food Insecurity: Not on file  Transportation Needs: Not on file  Physical Activity: Not on file  Stress: Not on file  Social Connections: Not on file   Family  History  Problem Relation Age of Onset   Diabetes Mother    No Known Allergies Current Outpatient Medications  Medication Sig Dispense Refill   acetaminophen (TYLENOL) 500 MG tablet Take 500 mg by mouth every 6 (six) hours as needed for headache.     aspirin EC 325 MG tablet Take 1 tablet (325 mg total) by mouth daily. (Patient not taking: No sig reported) 30 tablet 0   bacitracin ointment Apply 1 application topically 2 (two) times daily. (Patient not taking: No sig reported) 120 g 0   etonogestrel-ethinyl estradiol (NUVARING) 0.12-0.015 MG/24HR vaginal ring Place 1 each vaginally.     ibuprofen (ADVIL) 800 MG tablet Take 800 mg by mouth every 8 (eight) hours as needed for headache.     oxyCODONE (OXY IR/ROXICODONE) 5 MG immediate release tablet Take 1 tablet (5 mg total) by mouth every 4 (four) hours as needed (severe pain). 20 tablet 0   scopolamine (TRANSDERM-SCOP) 1 MG/3DAYS Place 1 patch (1.5 mg total) onto the skin every 3 (three) days. 10 patch 12   No current facility-administered medications for this visit.   No results found.  Review of Systems:   A ROS was performed including pertinent positives and negatives as documented in the HPI.   Musculoskeletal Exam:    There were no vitals taken  for this visit.  Right knee incisions are well-healing.  Patella is well centralized.  Trace effusion about the knee.  She has guarding about the quad but overall with coaching she is able to get to approximately 45 degrees of flexion without difficulty Imaging:   X-ray right knee 3 views Status post tibial tubercle osteotomy without evidence of complication  I personally reviewed and interpreted the radiographs.   Assessment:   25 year old female 2-week status post right MPFL reconstruction with tibial tubercle osteotomy.  At this time I would like her to advance to full weightbearing.  She should discontinue to use her crutches at this time.  At this time I would like her to  significantly start beginning strengthening of the quad muscle.  She may be range of motion as tolerated.  She will continue to work on knee flexion. Plan :    -Return to clinic in 4 weeks     I personally saw and evaluated the patient, and participated in the management and treatment plan.  [name], MD Attending Physician, Orthopedic Surgery  This document was dictated using Dragon voice recognition software. A reasonable attempt at proof reading has been made to minimize errors.
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
## 2021-10-16 ENCOUNTER — Ambulatory Visit (HOSPITAL_BASED_OUTPATIENT_CLINIC_OR_DEPARTMENT_OTHER): Payer: 59 | Admitting: Physical Therapy

## 2021-10-18 ENCOUNTER — Encounter (HOSPITAL_BASED_OUTPATIENT_CLINIC_OR_DEPARTMENT_OTHER): Payer: Self-pay | Admitting: Physical Therapy

## 2021-10-18 ENCOUNTER — Other Ambulatory Visit: Payer: Self-pay

## 2021-10-18 ENCOUNTER — Ambulatory Visit (HOSPITAL_BASED_OUTPATIENT_CLINIC_OR_DEPARTMENT_OTHER): Payer: 59 | Admitting: Physical Therapy

## 2021-10-18 DIAGNOSIS — R262 Difficulty in walking, not elsewhere classified: Secondary | ICD-10-CM

## 2021-10-18 DIAGNOSIS — M25561 Pain in right knee: Secondary | ICD-10-CM | POA: Diagnosis not present

## 2021-10-18 DIAGNOSIS — R600 Localized edema: Secondary | ICD-10-CM

## 2021-10-18 DIAGNOSIS — M25661 Stiffness of right knee, not elsewhere classified: Secondary | ICD-10-CM

## 2021-10-18 DIAGNOSIS — M6281 Muscle weakness (generalized): Secondary | ICD-10-CM
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
## 2021-10-25 ENCOUNTER — Encounter (HOSPITAL_BASED_OUTPATIENT_CLINIC_OR_DEPARTMENT_OTHER): Payer: Self-pay | Admitting: Physical Therapy

## 2021-10-25 ENCOUNTER — Other Ambulatory Visit: Payer: Self-pay

## 2021-10-25 ENCOUNTER — Ambulatory Visit (HOSPITAL_BASED_OUTPATIENT_CLINIC_OR_DEPARTMENT_OTHER): Payer: 59 | Admitting: Physical Therapy

## 2021-10-25 DIAGNOSIS — R262 Difficulty in walking, not elsewhere classified: Secondary | ICD-10-CM

## 2021-10-25 DIAGNOSIS — R600 Localized edema: Secondary | ICD-10-CM

## 2021-10-25 DIAGNOSIS — M25561 Pain in right knee: Secondary | ICD-10-CM | POA: Diagnosis not present

## 2021-10-25 DIAGNOSIS — M6281 Muscle weakness (generalized): Secondary | ICD-10-CM

## 2021-10-25 DIAGNOSIS — M25661 Stiffness of right knee, not elsewhere classified: Secondary | ICD-10-CM
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
## 2021-10-27 ENCOUNTER — Encounter (HOSPITAL_BASED_OUTPATIENT_CLINIC_OR_DEPARTMENT_OTHER): Payer: Self-pay | Admitting: Physical Therapy

## 2021-10-27 ENCOUNTER — Other Ambulatory Visit: Payer: Self-pay

## 2021-10-27 ENCOUNTER — Telehealth: Payer: Self-pay | Admitting: Orthopaedic Surgery

## 2021-10-27 ENCOUNTER — Ambulatory Visit (HOSPITAL_BASED_OUTPATIENT_CLINIC_OR_DEPARTMENT_OTHER): Payer: 59 | Admitting: Physical Therapy

## 2021-10-27 DIAGNOSIS — M25561 Pain in right knee: Secondary | ICD-10-CM

## 2021-10-27 DIAGNOSIS — R262 Difficulty in walking, not elsewhere classified: Secondary | ICD-10-CM

## 2021-10-27 DIAGNOSIS — R600 Localized edema: Secondary | ICD-10-CM

## 2021-10-27 DIAGNOSIS — M6281 Muscle weakness (generalized): Secondary | ICD-10-CM

## 2021-10-27 DIAGNOSIS — M25661 Stiffness of right knee, not elsewhere classified: Secondary | ICD-10-CM

## 2021-10-27 NOTE — Telephone Encounter (Signed)
LMOM regarding disability paperwork and left the phone number to [employer] for her to call.

## 2021-10-27 NOTE — Telephone Encounter (Signed)
LMOM informing patient Dr. Steward Drone will not refill oxycodone again. Informed her she can still take OTC medications for pain

## 2021-10-27 NOTE — Telephone Encounter (Signed)
Patient requesting prescription refill for oxycodone.  Patient also requesting f/u on a document from [employer] regarding disability for her job. Please advise.
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
## 2021-10-31 ENCOUNTER — Encounter (HOSPITAL_BASED_OUTPATIENT_CLINIC_OR_DEPARTMENT_OTHER): Payer: Self-pay | Admitting: Physical Therapy

## 2021-10-31 ENCOUNTER — Other Ambulatory Visit: Payer: Self-pay

## 2021-10-31 ENCOUNTER — Ambulatory Visit (HOSPITAL_BASED_OUTPATIENT_CLINIC_OR_DEPARTMENT_OTHER): Payer: 59 | Attending: Orthopaedic Surgery | Admitting: Physical Therapy

## 2021-10-31 DIAGNOSIS — M25561 Pain in right knee: Secondary | ICD-10-CM | POA: Diagnosis present

## 2021-10-31 DIAGNOSIS — M25661 Stiffness of right knee, not elsewhere classified: Secondary | ICD-10-CM | POA: Diagnosis present

## 2021-10-31 DIAGNOSIS — M6281 Muscle weakness (generalized): Secondary | ICD-10-CM | POA: Insufficient documentation

## 2021-10-31 DIAGNOSIS — R262 Difficulty in walking, not elsewhere classified: Secondary | ICD-10-CM | POA: Insufficient documentation

## 2021-10-31 DIAGNOSIS — R600 Localized edema: Secondary | ICD-10-CM | POA: Insufficient documentation
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
## 2021-11-01 ENCOUNTER — Telehealth (HOSPITAL_BASED_OUTPATIENT_CLINIC_OR_DEPARTMENT_OTHER): Payer: Self-pay | Admitting: Orthopaedic Surgery

## 2021-11-01 ENCOUNTER — Telehealth: Payer: Self-pay | Admitting: Orthopaedic Surgery

## 2021-11-01 NOTE — Telephone Encounter (Signed)
Please advise of patients work status. Thanks! °

## 2021-11-02 ENCOUNTER — Other Ambulatory Visit: Payer: Self-pay

## 2021-11-02 ENCOUNTER — Ambulatory Visit (HOSPITAL_BASED_OUTPATIENT_CLINIC_OR_DEPARTMENT_OTHER): Payer: 59 | Admitting: Physical Therapy

## 2021-11-02 DIAGNOSIS — M6281 Muscle weakness (generalized): Secondary | ICD-10-CM

## 2021-11-02 DIAGNOSIS — M25561 Pain in right knee: Secondary | ICD-10-CM | POA: Diagnosis not present

## 2021-11-02 DIAGNOSIS — R600 Localized edema: Secondary | ICD-10-CM

## 2021-11-02 DIAGNOSIS — R262 Difficulty in walking, not elsewhere classified: Secondary | ICD-10-CM

## 2021-11-02 DIAGNOSIS — M25661 Stiffness of right knee, not elsewhere classified: Secondary | ICD-10-CM

## 2021-11-02 NOTE — Telephone Encounter (Signed)
Updated work note
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
## 2021-11-03 ENCOUNTER — Encounter (HOSPITAL_BASED_OUTPATIENT_CLINIC_OR_DEPARTMENT_OTHER): Payer: Self-pay | Admitting: Physical Therapy

## 2021-11-06 ENCOUNTER — Other Ambulatory Visit: Payer: Self-pay

## 2021-11-06 ENCOUNTER — Encounter (HOSPITAL_BASED_OUTPATIENT_CLINIC_OR_DEPARTMENT_OTHER): Payer: Self-pay | Admitting: Physical Therapy

## 2021-11-06 ENCOUNTER — Ambulatory Visit (HOSPITAL_BASED_OUTPATIENT_CLINIC_OR_DEPARTMENT_OTHER): Payer: 59 | Admitting: Physical Therapy

## 2021-11-06 DIAGNOSIS — M25561 Pain in right knee: Secondary | ICD-10-CM | POA: Diagnosis not present

## 2021-11-06 DIAGNOSIS — R262 Difficulty in walking, not elsewhere classified: Secondary | ICD-10-CM

## 2021-11-06 DIAGNOSIS — M25661 Stiffness of right knee, not elsewhere classified: Secondary | ICD-10-CM

## 2021-11-06 DIAGNOSIS — R600 Localized edema: Secondary | ICD-10-CM

## 2021-11-06 DIAGNOSIS — M6281 Muscle weakness (generalized): Secondary | ICD-10-CM
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
## 2021-11-07 ENCOUNTER — Telehealth: Payer: Self-pay | Admitting: Orthopaedic Surgery

## 2021-11-07 NOTE — Telephone Encounter (Signed)
[employer] forms received. To [employer]. °

## 2021-11-08 ENCOUNTER — Ambulatory Visit (HOSPITAL_BASED_OUTPATIENT_CLINIC_OR_DEPARTMENT_OTHER): Payer: 59 | Admitting: Physical Therapy

## 2021-11-08 ENCOUNTER — Telehealth (HOSPITAL_BASED_OUTPATIENT_CLINIC_OR_DEPARTMENT_OTHER): Payer: Self-pay | Admitting: Orthopaedic Surgery

## 2021-11-09 NOTE — Telephone Encounter (Signed)
Dr. Steward Drone was unable to utilize the phone number. I called pt to inform her of this and [facility] and informed her to call the disability company to have them leave [place] a message with a phone number for Dr. Steward Drone to call for the peer to peer conversation.

## 2021-11-12 ENCOUNTER — Other Ambulatory Visit: Payer: Self-pay

## 2021-11-12 ENCOUNTER — Emergency Department (HOSPITAL_COMMUNITY)
Admission: EM | Admit: 2021-11-12 | Discharge: 2021-11-12 | Disposition: A | Discharge status: Home / Self Care | Payer: 59 | Attending: Emergency Medicine | Admitting: Emergency Medicine

## 2021-11-12 ENCOUNTER — Encounter (HOSPITAL_COMMUNITY): Payer: Self-pay

## 2021-11-12 DIAGNOSIS — R519 Headache, unspecified: Secondary | ICD-10-CM | POA: Diagnosis present

## 2021-11-12 DIAGNOSIS — Z7982 Long term (current) use of aspirin: Secondary | ICD-10-CM | POA: Insufficient documentation

## 2021-11-12 DIAGNOSIS — J028 Acute pharyngitis due to other specified organisms: Secondary | ICD-10-CM | POA: Insufficient documentation

## 2021-11-12 DIAGNOSIS — B9789 Other viral agents as the cause of diseases classified elsewhere: Secondary | ICD-10-CM | POA: Insufficient documentation

## 2021-11-12 DIAGNOSIS — J029 Acute pharyngitis, unspecified: Secondary | ICD-10-CM

## 2021-11-12 LAB — GROUP A STREP BY PCR: Group A Strep by PCR: NOT DETECTED

## 2021-11-12 MED ORDER — NAPROXEN 500 MG PO TABS: 500.0000 mg | ORAL_TABLET | Freq: Two times a day (BID) | ORAL | 0 refills | Status: AC | PRN

## 2021-11-12 MED ORDER — ALUM & MAG HYDROXIDE-SIMETH 200-200-20 MG/5ML PO SUSP
30.0000 mL | Freq: Once | ORAL | Status: AC
  Administered 2021-11-12: 30 mL via ORAL
  Filled 2021-11-12: qty 30

## 2021-11-12 MED ORDER — LIDOCAINE VISCOUS HCL 2 % MT SOLN
15.0000 mL | Freq: Once | OROMUCOSAL | Status: AC
Start: 2021-11-12 — End: 2021-11-12
  Administered 2021-11-12: 15 mL via ORAL
  Filled 2021-11-12: qty 15

## 2021-11-12 MED ORDER — CHLORASEPTIC 1.4 % MT LIQD
1.0000 | OROMUCOSAL | 0 refills | Status: AC | PRN
Start: 2021-11-12 — End: ?

## 2021-11-12 MED ORDER — DEXAMETHASONE SODIUM PHOSPHATE 10 MG/ML IJ SOLN
10.0000 mg | Freq: Once | INTRAMUSCULAR | Status: AC
Start: 2021-11-12 — End: 2021-11-12
  Administered 2021-11-12: 10 mg via INTRAMUSCULAR
  Filled 2021-11-12: qty 1

## 2021-11-12 MED ORDER — KETOROLAC TROMETHAMINE 15 MG/ML IJ SOLN
15.0000 mg | Freq: Once | INTRAMUSCULAR | Status: AC
Start: 2021-11-12 — End: 2021-11-12
  Administered 2021-11-12: 15 mg via INTRAMUSCULAR
  Filled 2021-11-12: qty 1

## 2021-11-12 NOTE — ED Provider Notes (Signed)
[facility] DEPT Provider Note   CSN: [id number] Arrival date & time: 11/12/21  0209     History  Chief Complaint  Patient presents with   Headache   Sore Throat    Sharon Powell is a 26 y.o. female.  26 year old presenting for sore throat x2 days.  Sore throat has been constant and unrelieved with tea and throat lozenges.  Patient reports worsening pain with swallowing making it difficult for her to tolerate food or fluids.  She is unsure of any sick contacts.  No associated fever.  The history is provided by the patient. No language interpreter was used.  Headache Associated symptoms: sore throat   Sore Throat Associated symptoms include headaches.      Home Medications Prior to Admission medications   Medication Sig Start Date End Date Taking? Authorizing Provider  naproxen (NAPROSYN) 500 MG tablet Take 1 tablet (500 mg total) by mouth every 12 (twelve) hours as needed for moderate pain or mild pain. 11/12/21  Yes Antonietta Breach, PA-C  phenol (CHLORASEPTIC) 1.4 % LIQD Use as directed 1 spray in the mouth or throat as needed for throat irritation / pain. 11/12/21  Yes Antonietta Breach, PA-C  acetaminophen (TYLENOL) 500 MG tablet Take 500 mg by mouth every 6 (six) hours as needed for headache.    [provider]  aspirin EC 325 MG tablet Take 1 tablet (325 mg total) by mouth daily. Patient not taking: No sig reported 08/15/21   Vanetta Mulders, MD  bacitracin ointment Apply 1 application topically 2 (two) times daily. Patient not taking: No sig reported 07/02/21   Loeffler, Adora Fridge, PA-C  etonogestrel-ethinyl estradiol (NUVARING) 0.12-0.015 MG/24HR vaginal ring Place 1 each vaginally.    [provider]  ibuprofen (ADVIL) 800 MG tablet Take 800 mg by mouth every 8 (eight) hours as needed for headache.    [provider]  oxyCODONE (OXY IR/ROXICODONE) 5 MG immediate release tablet Take 1 tablet (5 mg total) by mouth every 4 (four)  hours as needed (severe pain). 09/14/21   Vanetta Mulders, MD  scopolamine (TRANSDERM-SCOP) 1 MG/3DAYS Place 1 patch (1.5 mg total) onto the skin every 3 (three) days. 09/01/21   Vanetta Mulders, MD      Allergies    Patient has no known allergies.    Review of Systems   Review of Systems  HENT:  Positive for sore throat.   Neurological:  Positive for headaches.  Ten systems reviewed and are negative for acute change, except as noted in the HPI.    Physical Exam Updated Vital Signs BP 128/86 (BP Location: Left Arm)    Pulse 95    Temp 98.5 F (36.9 C) (Oral)    Resp 19    Ht 5\' 5"  (1.651 m)    Wt 75 kg    SpO2 100%    BMI 27.51 kg/m  Physical Exam Vitals and nursing note reviewed.  Constitutional:      General: She is not in acute distress.    Appearance: She is well-developed. She is not diaphoretic.     Comments: Nontoxic-appearing  HENT:     Head: Normocephalic and atraumatic.     Mouth/Throat:     Mouth: Mucous membranes are moist.     Comments: Minimal posterior oropharyngeal erythema.  No significant edema.  Uvula midline.  No tonsillar exudates.  Tolerating secretions without tripoding, drooling.  Whispers to answer questions, but no voice muffling. Eyes:     General: No  scleral icterus.    Extraocular Movements: Extraocular movements intact.     Conjunctiva/sclera: Conjunctivae normal.  Neck:     Comments: No nuchal rigidity or meningismus Pulmonary:     Effort: Pulmonary effort is normal. No respiratory distress.     Comments: Respirations even and unlabored Musculoskeletal:        General: Normal range of motion.     Cervical back: Normal range of motion.  Skin:    General: Skin is warm and dry.     Coloration: Skin is not pale.     Findings: No erythema or rash.  Neurological:     Mental Status: She is alert and oriented to person, place, and time.     Coordination: Coordination normal.  Psychiatric:        Behavior: Behavior normal.    ED Results /  Procedures / Treatments   Labs (all labs ordered are listed, but only abnormal results are displayed) Labs Reviewed  GROUP A STREP BY PCR    EKG None  Radiology No results found.  Procedures Procedures    Medications Ordered in ED Medications  ketorolac (TORADOL) 15 MG/ML injection 15 mg (15 mg Intramuscular Given 11/12/21 0243)  dexamethasone (DECADRON) injection 10 mg (10 mg Intramuscular Given 11/12/21 0243)  alum & mag hydroxide-simeth (MAALOX/MYLANTA) 200-200-20 MG/5ML suspension 30 mL (30 mLs Oral Given 11/12/21 0241)    And  lidocaine (XYLOCAINE) 2 % viscous mouth solution 15 mL (15 mLs Oral Given 11/12/21 0242)    ED Course/ Medical Decision Making/ A&P                           Medical Decision Making  Pt afebrile without tonsillar exudate, negative strep. Presents with mild cervical lymphadenopathy & dysphagia; diagnosis of viral pharyngitis. No abx indicated. DC w symptomatic tx for pain  Presentation not concerning for PTA or infxn spread to soft tissue. No trismus or uvula deviation. Recommended PCP follow up. Specific return precautions discussed. Return precautions discussed and provided. Patient discharged in stable condition with no unaddressed concerns.        Final Clinical Impression(s) / ED Diagnoses Final diagnoses:  Viral pharyngitis    Rx / DC Orders ED Discharge Orders          Ordered    naproxen (NAPROSYN) 500 MG tablet  Every 12 hours PRN        11/12/21 0506    phenol (CHLORASEPTIC) 1.4 % LIQD  As needed        11/12/21 0506              Antonietta Breach, PA-C 11/12/21 [id number]    Maudie Flakes, MD 11/12/21 [phone]

## 2021-11-12 NOTE — Discharge Instructions (Addendum)
Your strep screen is negative.  We believe that your symptoms are due to a viral illness.  We recommend naproxen as prescribed for management of pain.  You may also use Chloraseptic spray as instructed.  Continue with other over-the-counter remedies for symptom control, as desired.  Drink plenty of fluids to prevent dehydration.  Follow-up with a primary care doctor to ensure resolution of symptoms.

## 2021-11-12 NOTE — ED Triage Notes (Signed)
Patient arrives with a sore throat 2 days ago. She said she cannot swallow now. Also complaining with a bad headache for months.

## 2021-11-13 ENCOUNTER — Ambulatory Visit (INDEPENDENT_AMBULATORY_CARE_PROVIDER_SITE_OTHER): Payer: 59 | Admitting: Orthopaedic Surgery

## 2021-11-13 ENCOUNTER — Other Ambulatory Visit (HOSPITAL_BASED_OUTPATIENT_CLINIC_OR_DEPARTMENT_OTHER): Payer: Self-pay

## 2021-11-13 ENCOUNTER — Ambulatory Visit (HOSPITAL_BASED_OUTPATIENT_CLINIC_OR_DEPARTMENT_OTHER): Payer: 59 | Admitting: Physical Therapy

## 2021-11-13 ENCOUNTER — Ambulatory Visit (HOSPITAL_BASED_OUTPATIENT_CLINIC_OR_DEPARTMENT_OTHER): Payer: Self-pay | Admitting: Orthopaedic Surgery

## 2021-11-13 DIAGNOSIS — M24661 Ankylosis, right knee: Secondary | ICD-10-CM

## 2021-11-13 DIAGNOSIS — S83001A Unspecified subluxation of right patella, initial encounter: Secondary | ICD-10-CM

## 2021-11-13 MED ORDER — ACETAMINOPHEN 500 MG PO TABS: 500.0000 mg | ORAL_TABLET | Freq: Three times a day (TID) | ORAL | 0 refills | Status: AC

## 2021-11-13 MED ORDER — OXYCODONE HCL 5 MG PO TABS
5.0000 mg | ORAL_TABLET | ORAL | 0 refills | Status: DC | PRN
Start: 2021-11-13 — End: 2022-03-30

## 2021-11-13 MED ORDER — ASPIRIN EC 325 MG PO TBEC: 325.0000 mg | DELAYED_RELEASE_TABLET | Freq: Every day | ORAL | 0 refills | Status: DC

## 2021-11-13 MED ORDER — ASPIRIN EC 325 MG PO TBEC
325.0000 mg | DELAYED_RELEASE_TABLET | Freq: Every day | ORAL | 0 refills | Status: DC
  Filled 2021-11-13: qty 14, 14d supply, fill #0

## 2021-11-13 MED ORDER — OXYCODONE HCL 5 MG PO TABS
5.0000 mg | ORAL_TABLET | ORAL | 0 refills | Status: DC | PRN
  Filled 2021-11-13: qty 20, 4d supply, fill #0

## 2021-11-13 MED ORDER — IBUPROFEN 800 MG PO TABS: 800.0000 mg | ORAL_TABLET | Freq: Three times a day (TID) | ORAL | 0 refills | Status: AC

## 2021-11-13 MED ORDER — IBUPROFEN 800 MG PO TABS
800.0000 mg | ORAL_TABLET | Freq: Three times a day (TID) | ORAL | 0 refills | Status: DC
  Filled 2021-11-13: qty 30, 10d supply, fill #0

## 2021-11-13 MED ORDER — ACETAMINOPHEN 500 MG PO TABS
500.0000 mg | ORAL_TABLET | Freq: Three times a day (TID) | ORAL | 0 refills | Status: DC
  Filled 2021-11-13: qty 30, 10d supply, fill #0

## 2021-11-13 NOTE — Addendum Note (Signed)
Addended by: Benancio Deeds on: 11/13/2021 12:21 PM   Modules accepted: Orders

## 2021-11-13 NOTE — Progress Notes (Signed)
Post Operative Evaluation    Procedure/Date of Surgery: 08/31/21 right knee MPFL reconstruction and TTO  Interval History:   11/13/2021: Presents today for follow-up of the above procedure.  Overall the knee does feel very solid.  She is working impressively on strengthening and range of motion although this is seem to have hit a plateau.  She has full extension although it is very limited at 90 degrees of flexion and beyond.  This is leading to lower back pain as well as trouble with stairs  PMH/PSH/Family History/Social History/Meds/Allergies:    Past Medical History:  Diagnosis Date   Anemia    Migraines    Scoliosis    Past Surgical History:  Procedure Laterality Date   CYST REMOVAL LEG     KNEE ARTHROSCOPY Right 08/31/2021   Procedure: ARTHROSCOPY KNEE;  Surgeon: [name], MD;  Location: [facility];  Service: Orthopedics;  Laterality: Right;   MEDIAL PATELLOFEMORAL LIGAMENT REPAIR Right 08/31/2021   Procedure: RIGHT MEDIAL PATELLAFEMORAL RECONSTRUCTION WITH TIBIAL TUBERCLE OSTEOTOMY, RIGHT KNEE ARTHROSCOPY LATERAL MENISCAL DEBRIDEMENT;  Surgeon: [name], MD;  Location: [facility] OR;  Service: Orthopedics;  Laterality: Right;   Social History   Socioeconomic History   Marital status: Single    Spouse name: Not on file   Number of children: Not on file   Years of education: Not on file   Highest education level: Not on file  Occupational History   Occupation: [employer]  Tobacco Use   Smoking status: Never   Smokeless tobacco: Never  Vaping Use   Vaping Use: Never used  Substance and Sexual Activity   Alcohol use: Not Currently   Drug use: No   Sexual activity: Yes    Birth control/protection: Pill, Implant  Other Topics Concern   Not on file  Social History Narrative   Not on file   Social Determinants of Health   Financial Resource Strain: Not on file  Food Insecurity: Not on file  Transportation Needs: Not on file  Physical  Activity: Not on file  Stress: Not on file  Social Connections: Not on file   Family History  Problem Relation Age of Onset   Diabetes Mother    No Known Allergies Current Outpatient Medications  Medication Sig Dispense Refill   acetaminophen (TYLENOL) 500 MG tablet Take 500 mg by mouth every 6 (six) hours as needed for headache.     aspirin EC 325 MG tablet Take 1 tablet (325 mg total) by mouth daily. (Patient not taking: No sig reported) 30 tablet 0   bacitracin ointment Apply 1 application topically 2 (two) times daily. (Patient not taking: No sig reported) 120 g 0   etonogestrel-ethinyl estradiol (NUVARING) 0.12-0.015 MG/24HR vaginal ring Place 1 each vaginally.     ibuprofen (ADVIL) 800 MG tablet Take 800 mg by mouth every 8 (eight) hours as needed for headache.     naproxen (NAPROSYN) 500 MG tablet Take 1 tablet (500 mg total) by mouth every 12 (twelve) hours as needed for moderate pain or mild pain. 30 tablet 0   oxyCODONE (OXY IR/ROXICODONE) 5 MG immediate release tablet Take 1 tablet (5 mg total) by mouth every 4 (four) hours as needed (severe pain). 20 tablet 0   phenol (CHLORASEPTIC) 1.4 % LIQD Use as directed 1 spray in the mouth or throat  as needed for throat irritation / pain. 20 mL 0   scopolamine (TRANSDERM-SCOP) 1 MG/3DAYS Place 1 patch (1.5 mg total) onto the skin every 3 (three) days. 10 patch 12   No current facility-administered medications for this visit.   No results found.  Review of Systems:   A ROS was performed including pertinent positives and negatives as documented in the HPI.   Musculoskeletal Exam:    There were no vitals taken for this visit.  Right knee incisions are well-healing.  Patella is well centralized.  Internal Imaging:   X-ray right knee 3 views Status post tibial tubercle osteotomy without evidence of complication  I personally reviewed and interpreted the radiographs.   Assessment:   26 year old female 10-week status post right  MPFL reconstruction with tibial tubercle osteotomy.  She is now out of her brace and is working on strengthening and range of motion of the knee.  That being said I do believe that there is somewhat restricted range of motion of the knee to 90 degrees of flexion only as result of arthrofibrosis.  At this time I have offered her a arthroscopic lysis of adhesions and manipulation under anesthesia so that we may allow her to regain her terminal motion.  She understands this and would like to proceed.  We did discuss that she may ultimately be weightbearing as tolerated following this.  I would like her to participate in physical therapy 2 days postop which she will schedule as this will optimize her range of motion from this procedure Plan :    -Plan for right knee arthroscopic lysis of adhesions with manipulation under anesthesia    After a lengthy discussion of treatment options, including risks, benefits, alternatives, complications of surgical and nonsurgical conservative options, the patient elected surgical repair.   The patient  is aware of the material risks  and complications including, but not limited to injury to adjacent structures, neurovascular injury, infection, numbness, bleeding, implant failure, thermal burns, stiffness, persistent pain, failure to heal, disease transmission from allograft, need for further surgery, dislocation, anesthetic risks, blood clots, risks of death,and others. The probabilities of surgical success and failure discussed with patient given their particular co-morbidities.The time and nature of expected rehabilitation and recovery was discussed.The patient's questions were all answered preoperatively.  No barriers to understanding were noted. I explained the natural history of the disease process and Rx rationale.  I explained to the patient what I considered to be reasonable expectations given their personal situation.  The final treatment plan was arrived at through  a shared patient decision making process model.      I personally saw and evaluated the patient, and participated in the management and treatment plan.  [name], MD Attending Physician, Orthopedic Surgery  This document was dictated using Dragon voice recognition software. A reasonable attempt at proof reading has been made to minimize errors.

## 2021-11-15 ENCOUNTER — Other Ambulatory Visit (HOSPITAL_BASED_OUTPATIENT_CLINIC_OR_DEPARTMENT_OTHER): Payer: Self-pay | Admitting: Orthopaedic Surgery

## 2021-11-15 ENCOUNTER — Ambulatory Visit (HOSPITAL_BASED_OUTPATIENT_CLINIC_OR_DEPARTMENT_OTHER): Payer: 59 | Admitting: Physical Therapy

## 2021-11-15 ENCOUNTER — Encounter (HOSPITAL_BASED_OUTPATIENT_CLINIC_OR_DEPARTMENT_OTHER): Payer: Self-pay | Admitting: Physical Therapy

## 2021-11-15 DIAGNOSIS — M24661 Ankylosis, right knee: Secondary | ICD-10-CM

## 2021-11-20 ENCOUNTER — Encounter (HOSPITAL_BASED_OUTPATIENT_CLINIC_OR_DEPARTMENT_OTHER): Payer: Self-pay | Admitting: Physical Therapy

## 2021-11-22 ENCOUNTER — Encounter (HOSPITAL_BASED_OUTPATIENT_CLINIC_OR_DEPARTMENT_OTHER): Payer: 59 | Admitting: Physical Therapy

## 2021-12-03 ENCOUNTER — Encounter (HOSPITAL_COMMUNITY): Payer: Self-pay

## 2021-12-03 ENCOUNTER — Other Ambulatory Visit: Payer: Self-pay

## 2021-12-03 ENCOUNTER — Emergency Department (HOSPITAL_COMMUNITY): Payer: 59

## 2021-12-03 ENCOUNTER — Emergency Department (HOSPITAL_COMMUNITY)
Admission: EM | Admit: 2021-12-03 | Discharge: 2021-12-03 | Disposition: A | Discharge status: Home / Self Care | Payer: 59 | Attending: Emergency Medicine | Admitting: Emergency Medicine

## 2021-12-03 DIAGNOSIS — R519 Headache, unspecified: Secondary | ICD-10-CM | POA: Diagnosis not present

## 2021-12-03 DIAGNOSIS — Z7982 Long term (current) use of aspirin: Secondary | ICD-10-CM | POA: Diagnosis not present

## 2021-12-03 DIAGNOSIS — R42 Dizziness and giddiness: Secondary | ICD-10-CM | POA: Diagnosis not present

## 2021-12-03 DIAGNOSIS — H53149 Visual discomfort, unspecified: Secondary | ICD-10-CM | POA: Insufficient documentation

## 2021-12-03 MED ORDER — PROCHLORPERAZINE EDISYLATE 10 MG/2ML IJ SOLN
10.0000 mg | Freq: Once | INTRAMUSCULAR | Status: AC
  Administered 2021-12-03: 10 mg via INTRAMUSCULAR
  Filled 2021-12-03: qty 2

## 2021-12-03 MED ORDER — DIPHENHYDRAMINE HCL 50 MG/ML IJ SOLN
25.0000 mg | Freq: Once | INTRAMUSCULAR | Status: AC
  Administered 2021-12-03: 25 mg via INTRAMUSCULAR
  Filled 2021-12-03: qty 1

## 2021-12-03 NOTE — Discharge Instructions (Signed)
Please follow-up with neurology.  I put a referral and you should expect a phone call in the coming days.  You may also call them if you do not get a phone call.  Please return to the emergency department for worsening symptoms.

## 2021-12-03 NOTE — ED Triage Notes (Signed)
Patient c/o headache x months. Patient states she has been seen twice before for the same. Patient states light sensitivity. Patient denies n/V and blurred vision.
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
## 2021-12-05 ENCOUNTER — Encounter: Payer: Self-pay | Admitting: Neurology

## 2021-12-12 ENCOUNTER — Encounter (HOSPITAL_COMMUNITY): Payer: Self-pay | Admitting: Orthopaedic Surgery

## 2021-12-12 ENCOUNTER — Other Ambulatory Visit: Payer: Self-pay

## 2021-12-12 NOTE — Progress Notes (Signed)
Ms. Sharon Powell denies chest pain or shortness of breath.  Patient denies having any s/s of Covid in her household.  Patient denies any known exposure to Covid.   Ms. Sharon Powell does not have  a PCP.  Patient does see a GYN.  Ms Sharon Powell reports that she has been on her menstrual period since 11/24/21, patient reports that she feels like she may be anemic.  I encouraged Ms. Sharon Powell to make an appointment with her Gyn.  I instructed Ms Sharon Powell to shower with antibiotic soap, if it is available.  Dry off with a clean towel. Do not put lotion, powder, cologne or deodorant or makeup.No jewelry or piercings. Men may shave their face and neck. Woman should not shave. No nail polish, artificial or acrylic nails. Wear clean clothes, brush your teeth. Glasses, contact lens,dentures or partials may not be worn in the OR. If you need to wear them, please bring a case for glasses, do not wear contacts or bring a case, the hospital does not have contact cases, dentures or partials will have to be removed , make sure they are clean, we will provide a denture cup to put them in. You will need some one to drive you home and a responsible person over the age of 56 to stay with you for the first 24 hours after surgery.

## 2021-12-13 ENCOUNTER — Encounter (HOSPITAL_COMMUNITY): Payer: Self-pay | Admitting: Orthopaedic Surgery

## 2021-12-14 ENCOUNTER — Ambulatory Visit (HOSPITAL_BASED_OUTPATIENT_CLINIC_OR_DEPARTMENT_OTHER): Payer: Self-pay | Admitting: Certified Registered Nurse Anesthetist

## 2021-12-14 ENCOUNTER — Other Ambulatory Visit: Payer: Self-pay

## 2021-12-14 ENCOUNTER — Ambulatory Visit (HOSPITAL_COMMUNITY)
Admission: RE | Admit: 2021-12-14 | Discharge: 2021-12-14 | Disposition: A | Discharge status: Home / Self Care | Payer: Self-pay | Attending: Orthopaedic Surgery | Admitting: Orthopaedic Surgery

## 2021-12-14 ENCOUNTER — Ambulatory Visit (HOSPITAL_COMMUNITY): Payer: Self-pay | Admitting: Certified Registered Nurse Anesthetist

## 2021-12-14 ENCOUNTER — Telehealth: Payer: Self-pay | Admitting: Orthopaedic Surgery

## 2021-12-14 ENCOUNTER — Encounter (HOSPITAL_COMMUNITY)
Admission: RE | Disposition: A | Discharge status: Home / Self Care | Payer: Self-pay | Source: Home / Self Care | Attending: Orthopaedic Surgery

## 2021-12-14 ENCOUNTER — Encounter (HOSPITAL_COMMUNITY): Payer: Self-pay | Admitting: Orthopaedic Surgery

## 2021-12-14 DIAGNOSIS — M24661 Ankylosis, right knee: Secondary | ICD-10-CM | POA: Insufficient documentation

## 2021-12-14 DIAGNOSIS — D573 Sickle-cell trait: Secondary | ICD-10-CM

## 2021-12-14 DIAGNOSIS — M794 Hypertrophy of (infrapatellar) fat pad: Secondary | ICD-10-CM | POA: Insufficient documentation

## 2021-12-14 DIAGNOSIS — S83001A Unspecified subluxation of right patella, initial encounter: Secondary | ICD-10-CM

## 2021-12-14 DIAGNOSIS — M545 Low back pain, unspecified: Secondary | ICD-10-CM | POA: Insufficient documentation

## 2021-12-14 DIAGNOSIS — T8482XA Fibrosis due to internal orthopedic prosthetic devices, implants and grafts, initial encounter: Secondary | ICD-10-CM

## 2021-12-14 HISTORY — DX: Concussion with loss of consciousness status unknown, initial encounter: S06.0XAA

## 2021-12-14 HISTORY — DX: Anxiety disorder, unspecified: F41.9

## 2021-12-14 HISTORY — PX: KNEE ARTHROSCOPY: SHX127

## 2021-12-14 LAB — CBC
HCT: 32.8 % — ABNORMAL LOW (ref 36.0–46.0)
Hemoglobin: 11 g/dL — ABNORMAL LOW (ref 12.0–15.0)
MCH: 29.5 pg (ref 26.0–34.0)
MCHC: 33.5 g/dL (ref 30.0–36.0)
MCV: 87.9 fL (ref 80.0–100.0)
Platelets: 309 10*3/uL (ref 150–400)
RBC: 3.73 MIL/uL — ABNORMAL LOW (ref 3.87–5.11)
RDW: 13.1 % (ref 11.5–15.5)
WBC: 9.3 10*3/uL (ref 4.0–10.5)
nRBC: 0 % (ref 0.0–0.2)

## 2021-12-14 LAB — POCT PREGNANCY, URINE: Preg Test, Ur: NEGATIVE

## 2021-12-14 SURGERY — ARTHROSCOPY, KNEE
Anesthesia: General | Site: Knee | Laterality: Right

## 2021-12-14 MED ORDER — PHENYLEPHRINE 40 MCG/ML (10ML) SYRINGE FOR IV PUSH (FOR BLOOD PRESSURE SUPPORT)
PREFILLED_SYRINGE | INTRAVENOUS | Status: AC
  Filled 2021-12-14: qty 10

## 2021-12-14 MED ORDER — HYDROMORPHONE HCL 1 MG/ML IJ SOLN
0.2500 mg | INTRAMUSCULAR | Status: DC | PRN
  Administered 2021-12-14 (×3): 0.5 mg via INTRAVENOUS

## 2021-12-14 MED ORDER — ORAL CARE MOUTH RINSE: 15.0000 mL | Freq: Once | OROMUCOSAL | Status: AC

## 2021-12-14 MED ORDER — OXYCODONE HCL 5 MG PO TABS
5.0000 mg | ORAL_TABLET | Freq: Once | ORAL | Status: DC
Start: 2021-12-14 — End: 2021-12-14

## 2021-12-14 MED ORDER — TRANEXAMIC ACID-NACL 1000-0.7 MG/100ML-% IV SOLN
1000.0000 mg | INTRAVENOUS | Status: AC
  Administered 2021-12-14: 1000 mg via INTRAVENOUS

## 2021-12-14 MED ORDER — MIDAZOLAM HCL 2 MG/2ML IJ SOLN
INTRAMUSCULAR | Status: AC
  Filled 2021-12-14: qty 2

## 2021-12-14 MED ORDER — MIDAZOLAM HCL 2 MG/2ML IJ SOLN
INTRAMUSCULAR | Status: AC
  Administered 2021-12-14: 1 mg
  Filled 2021-12-14: qty 2

## 2021-12-14 MED ORDER — EPINEPHRINE PF 1 MG/ML IJ SOLN
INTRAMUSCULAR | Status: AC
  Filled 2021-12-14: qty 2

## 2021-12-14 MED ORDER — LIDOCAINE 2% (20 MG/ML) 5 ML SYRINGE
INTRAMUSCULAR | Status: AC
  Filled 2021-12-14: qty 5

## 2021-12-14 MED ORDER — FENTANYL CITRATE (PF) 100 MCG/2ML IJ SOLN
INTRAMUSCULAR | Status: DC | PRN
  Administered 2021-12-14 (×4): 20 ug via INTRAVENOUS
  Administered 2021-12-14 (×2): 50 ug via INTRAVENOUS
  Administered 2021-12-14: 20 ug via INTRAVENOUS

## 2021-12-14 MED ORDER — PROPOFOL 10 MG/ML IV BOLUS
INTRAVENOUS | Status: DC | PRN
  Administered 2021-12-14: 180 mg via INTRAVENOUS

## 2021-12-14 MED ORDER — ACETAMINOPHEN 500 MG PO TABS: 1000.0000 mg | ORAL_TABLET | Freq: Once | ORAL | Status: AC

## 2021-12-14 MED ORDER — MIDAZOLAM HCL 2 MG/2ML IJ SOLN
0.5000 mg | Freq: Once | INTRAMUSCULAR | Status: DC
Start: 2021-12-14 — End: 2021-12-14

## 2021-12-14 MED ORDER — HYDROMORPHONE HCL 1 MG/ML IJ SOLN
INTRAMUSCULAR | Status: AC
  Filled 2021-12-14: qty 1

## 2021-12-14 MED ORDER — MIDAZOLAM HCL 2 MG/2ML IJ SOLN
1.0000 mg | Freq: Once | INTRAMUSCULAR | Status: DC
Start: 2021-12-14 — End: 2021-12-14
  Filled 2021-12-14: qty 1

## 2021-12-14 MED ORDER — MIDAZOLAM HCL 2 MG/2ML IJ SOLN
1.0000 mg | Freq: Once | INTRAMUSCULAR | Status: AC
  Administered 2021-12-14: 1 mg via INTRAVENOUS

## 2021-12-14 MED ORDER — DEXAMETHASONE SODIUM PHOSPHATE 10 MG/ML IJ SOLN
INTRAMUSCULAR | Status: AC
  Filled 2021-12-14: qty 1

## 2021-12-14 MED ORDER — DEXMEDETOMIDINE HCL 200 MCG/2ML IV SOLN
20.0000 ug | Freq: Once | INTRAVENOUS | Status: AC
  Administered 2021-12-14: 20 ug via INTRAVENOUS

## 2021-12-14 MED ORDER — CEFAZOLIN SODIUM-DEXTROSE 2-4 GM/100ML-% IV SOLN
INTRAVENOUS | Status: AC
  Filled 2021-12-14: qty 100

## 2021-12-14 MED ORDER — SODIUM CHLORIDE 0.9 % IR SOLN
Status: DC | PRN
  Administered 2021-12-14: 3000 mL

## 2021-12-14 MED ORDER — HYDROMORPHONE HCL 1 MG/ML IJ SOLN
0.2500 mg | INTRAMUSCULAR | Status: DC | PRN
  Administered 2021-12-14: 1 mg via INTRAVENOUS

## 2021-12-14 MED ORDER — CHLORHEXIDINE GLUCONATE 0.12 % MT SOLN
OROMUCOSAL | Status: AC
  Administered 2021-12-14: 15 mL via OROMUCOSAL
  Filled 2021-12-14: qty 15

## 2021-12-14 MED ORDER — DEXMEDETOMIDINE (PRECEDEX) IN NS 20 MCG/5ML (4 MCG/ML) IV SYRINGE
PREFILLED_SYRINGE | INTRAVENOUS | Status: DC | PRN
Start: 2021-12-14 — End: 2021-12-14
  Administered 2021-12-14: 20 ug via INTRAVENOUS

## 2021-12-14 MED ORDER — TRANEXAMIC ACID-NACL 1000-0.7 MG/100ML-% IV SOLN
INTRAVENOUS | Status: AC
  Filled 2021-12-14: qty 100

## 2021-12-14 MED ORDER — DEXMEDETOMIDINE (PRECEDEX) IN NS 20 MCG/5ML (4 MCG/ML) IV SYRINGE
PREFILLED_SYRINGE | INTRAVENOUS | Status: AC
  Filled 2021-12-14: qty 5

## 2021-12-14 MED ORDER — FENTANYL CITRATE (PF) 250 MCG/5ML IJ SOLN
INTRAMUSCULAR | Status: AC
  Filled 2021-12-14: qty 5

## 2021-12-14 MED ORDER — DEXMEDETOMIDINE HCL IN NACL 80 MCG/20ML IV SOLN
INTRAVENOUS | Status: AC
  Filled 2021-12-14: qty 20

## 2021-12-14 MED ORDER — CEFAZOLIN SODIUM-DEXTROSE 2-4 GM/100ML-% IV SOLN
2.0000 g | INTRAVENOUS | Status: AC
  Administered 2021-12-14: 2 g via INTRAVENOUS

## 2021-12-14 MED ORDER — GABAPENTIN 300 MG PO CAPS
ORAL_CAPSULE | ORAL | Status: AC
Start: 2021-12-14 — End: 2021-12-14
  Administered 2021-12-14: 300 mg via ORAL
  Filled 2021-12-14: qty 1

## 2021-12-14 MED ORDER — 0.9 % SODIUM CHLORIDE (POUR BTL) OPTIME
TOPICAL | Status: DC | PRN
  Administered 2021-12-14: 1000 mL

## 2021-12-14 MED ORDER — ONDANSETRON HCL 4 MG/2ML IJ SOLN
INTRAMUSCULAR | Status: AC
  Filled 2021-12-14: qty 2

## 2021-12-14 MED ORDER — ONDANSETRON HCL 4 MG/2ML IJ SOLN
INTRAMUSCULAR | Status: DC | PRN
  Administered 2021-12-14: 4 mg via INTRAVENOUS

## 2021-12-14 MED ORDER — HYDROMORPHONE HCL 1 MG/ML IJ SOLN
INTRAMUSCULAR | Status: AC
  Filled 2021-12-14: qty 2

## 2021-12-14 MED ORDER — LIDOCAINE 2% (20 MG/ML) 5 ML SYRINGE
INTRAMUSCULAR | Status: DC | PRN
Start: 2021-12-14 — End: 2021-12-14
  Administered 2021-12-14: 90 mg via INTRAVENOUS

## 2021-12-14 MED ORDER — MIDAZOLAM HCL 5 MG/5ML IJ SOLN
INTRAMUSCULAR | Status: DC | PRN
Start: 2021-12-14 — End: 2021-12-14
  Administered 2021-12-14: 2 mg via INTRAVENOUS

## 2021-12-14 MED ORDER — OXYCODONE HCL 5 MG PO TABS
ORAL_TABLET | ORAL | Status: AC
  Filled 2021-12-14: qty 1

## 2021-12-14 MED ORDER — HYDROMORPHONE HCL 1 MG/ML IJ SOLN
0.5000 mg | Freq: Once | INTRAMUSCULAR | Status: AC
  Administered 2021-12-14: 0.5 mg via INTRAVENOUS

## 2021-12-14 MED ORDER — PROPOFOL 10 MG/ML IV BOLUS
INTRAVENOUS | Status: AC
  Filled 2021-12-14: qty 20

## 2021-12-14 MED ORDER — GABAPENTIN 300 MG PO CAPS: 300.0000 mg | ORAL_CAPSULE | Freq: Once | ORAL | Status: AC

## 2021-12-14 MED ORDER — DEXAMETHASONE SODIUM PHOSPHATE 4 MG/ML IJ SOLN
INTRAMUSCULAR | Status: DC | PRN
  Administered 2021-12-14: 10 mg via INTRAVENOUS

## 2021-12-14 MED ORDER — ACETAMINOPHEN 10 MG/ML IV SOLN
INTRAVENOUS | Status: AC
  Filled 2021-12-14: qty 100

## 2021-12-14 MED ORDER — PHENYLEPHRINE HCL (PRESSORS) 10 MG/ML IV SOLN
INTRAVENOUS | Status: DC | PRN
  Administered 2021-12-14 (×6): 40 ug via INTRAVENOUS

## 2021-12-14 MED ORDER — CHLORHEXIDINE GLUCONATE 0.12 % MT SOLN: 15.0000 mL | Freq: Once | OROMUCOSAL | Status: AC

## 2021-12-14 MED ORDER — BUPIVACAINE-EPINEPHRINE (PF) 0.5% -1:200000 IJ SOLN
INTRAMUSCULAR | Status: DC | PRN
  Administered 2021-12-14: 25 mL via PERINEURAL

## 2021-12-14 MED ORDER — LACTATED RINGERS IV SOLN: INTRAVENOUS | Status: DC

## 2021-12-14 MED ORDER — ACETAMINOPHEN 500 MG PO TABS
ORAL_TABLET | ORAL | Status: AC
  Administered 2021-12-14: 1000 mg via ORAL
  Filled 2021-12-14: qty 2

## 2021-12-14 SURGICAL SUPPLY — 44 items
BAG COUNTER SPONGE SURGICOUNT (BAG) ×2 IMPLANT
BNDG ELASTIC 6X10 VLCR STRL LF (GAUZE/BANDAGES/DRESSINGS) IMPLANT
BURR OVAL 8 FLU 4.0X13 (MISCELLANEOUS) ×1 IMPLANT
CHLORAPREP W/TINT 26 (MISCELLANEOUS) ×2 IMPLANT
COVER SURGICAL LIGHT HANDLE (MISCELLANEOUS) ×2 IMPLANT
DRAPE ARTHROSCOPY W/POUCH 114 (DRAPES) ×2 IMPLANT
DRAPE U-SHAPE 47X51 STRL (DRAPES) ×2 IMPLANT
DRSG TEGADERM 4X4.75 (GAUZE/BANDAGES/DRESSINGS) ×3 IMPLANT
DW OUTFLOW CASSETTE/TUBE SET (MISCELLANEOUS) ×2 IMPLANT
EXCALIBUR 3.8MM X 13CM (MISCELLANEOUS) ×1 IMPLANT
GAUZE SPONGE 4X4 12PLY STRL (GAUZE/BANDAGES/DRESSINGS) ×1 IMPLANT
GAUZE XEROFORM 1X8 LF (GAUZE/BANDAGES/DRESSINGS) IMPLANT
GLOVE SRG 8 PF TXTR STRL LF DI (GLOVE) ×1 IMPLANT
GLOVE SURG ENC MOIS LTX SZ6 (GLOVE) ×6 IMPLANT
GLOVE SURG LTX SZ8 (GLOVE) ×4 IMPLANT
GLOVE SURG SYN 7.5  E (GLOVE) ×2
GLOVE SURG SYN 7.5 E (GLOVE) ×2 IMPLANT
GLOVE SURG SYN 7.5 PF PI (GLOVE) ×2 IMPLANT
GLOVE SURG UNDER POLY LF SZ6.5 (GLOVE) ×4 IMPLANT
GLOVE SURG UNDER POLY LF SZ8 (GLOVE) ×1
GOWN STRL REUS W/ TWL LRG LVL3 (GOWN DISPOSABLE) ×2 IMPLANT
GOWN STRL REUS W/ TWL XL LVL3 (GOWN DISPOSABLE) ×1 IMPLANT
GOWN STRL REUS W/TWL LRG LVL3 (GOWN DISPOSABLE) ×2
GOWN STRL REUS W/TWL XL LVL3 (GOWN DISPOSABLE) ×1
KIT BASIN OR (CUSTOM PROCEDURE TRAY) ×2 IMPLANT
KIT TURNOVER KIT B (KITS) ×2 IMPLANT
MANIFOLD NEPTUNE II (INSTRUMENTS) IMPLANT
NDL 18GX1X1/2 (RX/OR ONLY) (NEEDLE) IMPLANT
NDL HYPO 25GX1X1/2 BEV (NEEDLE) ×1 IMPLANT
NEEDLE 18GX1X1/2 (RX/OR ONLY) (NEEDLE) ×2 IMPLANT
NEEDLE HYPO 25GX1X1/2 BEV (NEEDLE) IMPLANT
NS IRRIG 1000ML POUR BTL (IV SOLUTION) ×1 IMPLANT
PACK ARTHROSCOPY DSU (CUSTOM PROCEDURE TRAY) ×2 IMPLANT
PAD ARMBOARD 7.5X6 YLW CONV (MISCELLANEOUS) ×4 IMPLANT
PADDING CAST COTTON 6X4 STRL (CAST SUPPLIES) ×1 IMPLANT
PROBE APOLLO 90XL (SURGICAL WAND) ×1 IMPLANT
SPONGE T-LAP 4X18 ~~LOC~~+RFID (SPONGE) ×2 IMPLANT
SUT ETHILON 3 0 PS 1 (SUTURE) ×3 IMPLANT
SYR 20ML ECCENTRIC (SYRINGE) ×2 IMPLANT
SYR TB 1ML LUER SLIP (SYRINGE) ×2 IMPLANT
TOWEL GREEN STERILE (TOWEL DISPOSABLE) ×2 IMPLANT
TOWEL GREEN STERILE FF (TOWEL DISPOSABLE) ×2 IMPLANT
TUBE CONNECTING 12X1/4 (SUCTIONS) ×2 IMPLANT
TUBING ARTHROSCOPY IRRIG 16FT (MISCELLANEOUS) ×2 IMPLANT

## 2021-12-14 NOTE — Op Note (Signed)
° °  Date of Surgery: 12/14/2021  INDICATIONS: Sharon Powell is a 26 y.o.-year-old female with right knee scarring and adhesions with limited motion following MPFL reconstruction with tibial tubercle osteotomy.  This has been limiting her rehab..  The risk and benefits of the procedure with discussed in detail and documented in the pre-operative evaluation.  PREOPERATIVE DIAGNOSIS: 1.  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  PROCEDURE: 1.  Right knee extensive synovectomy 2.  Right knee manipulation under anesthesia   SURGEON: Benancio Deeds MD  ASSISTANT: Tawny Asal  ANESTHESIA:  general plus abductor canal block  IV FLUIDS AND URINE: See anesthesia record.  ANTIBIOTICS: Ancef 2 g  ESTIMATED BLOOD LOSS: 5 g mL.  IMPLANTS:  * No implants in log *  DRAINS: None  CULTURES: None  COMPLICATIONS: none  DESCRIPTION OF PROCEDURE:  Examination under anesthesia: A careful examination under anesthesia was performed.  Knee ROM motion was: 0-95 premanipulation, -5-135 post manipulation Lachman: Normal Pivot Shift: Normal Posterior drawer: normal.   Varus stability in full extension: normal.   Varus stability in 30 degrees of flexion: normal.  Valgus stability in full extension: normal.   Valgus stability in 30 degrees of flexion: normal.  Posterolateral drawer: normal   Intra-operative findings: A thorough arthroscopic examination of the knee was performed.  The findings are: Good patellofemoral tracking 1. Suprapatellar pouch: Significant scarring 2. Undersurface of median ridge: Normal 3. Medial patellar facet: Normal 4. Lateral patellar facet: Normal 5. Trochlea: Large plica 6. Lateral gutter/popliteus tendon: Adhesions 7. Hoffa's fat pad: Significant hypertrophy 8. Medial gutter/plica: Normal 9. ACL: Normal with scarring to the anterior fat pad 10. PCL: Normal 11. Medial  meniscus: Normal 12. Medial compartment cartilage: Normal 13. Lateral meniscus: Normal 14. Lateral compartment cartilage: Normal    Patient was identified in the preoperative holding area.  The correct site was marked according to universal protocol with nursing.  Anesthesia subsequently performed a abductor canal nerve block.  She was taken back to the operating room.  Ancef was given 1 hour prior to skin incision.  She was prepped and draped in usual sterile fashion.  All bony prominences were padded.  Anesthesia was induced.  Final timeout was performed.  We began with standard lateral incision for a diagnostic arthroscopy.  The above findings were noted.  Medial portal was established with spinal needle.  There were significant adhesions noted in the lateral gutter the superior patellar pouch.  There was scarring of the ACL to the anterior fat pad.  This was all removed with a Excalibur 3.8 mm shaver and hemostasis was achieved with electrocautery following this.  At this time attention was turned to the plica.  This was debrided and hemostasis was achieved with electrocautery.  This involved the lateral gutter anterior knee and the superior knee.  At this time arthroscopic fluid was evacuated.  Incisions were closed with 3-0 nylon simple sutures.  The knee was manipulated and now able to achieve 135 degrees of flexion without difficulty.  Dressings were applied with gauze and Tegaderm.      POSTOPERATIVE PLAN: She will be weightbearing as tolerated on the right lower extremity.  She will be range of motion as tolerated.  I will see her back in 2 weeks for suture removal.  She will work with physical therapy.  Benancio Deeds, MD 8:49 AM

## 2021-12-14 NOTE — Interval H&P Note (Signed)
History and Physical Interval Note:  12/14/2021 7:13 AM  Sharon Powell  has presented today for surgery, with the diagnosis of Right knee arthrofibrosis.  The various methods of treatment have been discussed with the patient and family. After consideration of risks, benefits and other options for treatment, the patient has consented to  Procedure(s): Right ARTHROSCOPY KNEE Lysis of adhesions with munipulation under anesthesia (Right) as a surgical intervention.  The patient's history has been reviewed, patient examined, no change in status, stable for surgery.  I have reviewed the patient's chart and labs.  Questions were answered to the patient's satisfaction.     [name]

## 2021-12-14 NOTE — Anesthesia Postprocedure Evaluation (Signed)
Anesthesia Post Note  Patient: Sharon Powell  Procedure(s) Performed: Right ARTHROSCOPY KNEE Lysis of adhesions with munipulation under anesthesia (Right: Knee)     Patient location during evaluation: PACU Anesthesia Type: General Level of consciousness: awake Pain management: pain level controlled Vital Signs Assessment: post-procedure vital signs reviewed and stable Respiratory status: spontaneous breathing Cardiovascular status: stable Postop Assessment: no apparent nausea or vomiting Anesthetic complications: no   No notable events documented.  Last Vitals:  Vitals:   12/14/21 1151 12/14/21 1201  BP: 111/65 108/66  Pulse: 74 79  Resp: 12 12  Temp:    SpO2: 100% 100%    Last Pain:  Vitals:   12/14/21 1151  TempSrc:   PainSc: 0-No pain                 Avrie Kedzierski

## 2021-12-14 NOTE — Brief Op Note (Signed)
° °  Brief Op Note  Date of Surgery: 12/14/2021  Preoperative Diagnosis: Right knee arthrofibrosis  Postoperative Diagnosis: same  Procedure: Procedure(s): Right ARTHROSCOPY KNEE Lysis of adhesions with munipulation under anesthesia  Implants: * No implants in log *  Surgeons: Surgeon(s): [name], MD  Anesthesia: Choice    Estimated Blood Loss: See anesthesia record  Complications: None  Condition to PACU: Stable  Benancio Deeds, MD 12/14/2021 8:47 AM

## 2021-12-14 NOTE — Telephone Encounter (Signed)
Spoke to patient's mother and informed her the oxycodone was sent to [facility] 1/16.

## 2021-12-14 NOTE — Telephone Encounter (Signed)
Patient's mom called. [phone]. Would like to know if her medication has been sent to the pharmacy or not.

## 2021-12-14 NOTE — Anesthesia Procedure Notes (Signed)
Procedure Name: LMA Insertion Date/Time: 12/14/2021 7:46 AM Performed by: Cy Blamer, CRNA Pre-anesthesia Checklist: Patient identified, Emergency Drugs available, Suction available, Patient being monitored and Timeout performed Patient Re-evaluated:Patient Re-evaluated prior to induction Oxygen Delivery Method: Circle system utilized Preoxygenation: Pre-oxygenation with 100% oxygen Induction Type: IV induction LMA: LMA inserted LMA Size: 4.0 Number of attempts: 1 Tube secured with: Tape Dental Injury: Teeth and Oropharynx as per pre-operative assessment

## 2021-12-14 NOTE — Anesthesia Procedure Notes (Addendum)
°  Anesthesia Regional Block: Adductor canal block   Pre-Anesthetic Checklist: , timeout performed,  Correct Patient, Correct Site, Correct Laterality,  Correct Procedure, Correct Position, site marked,  Risks and benefits discussed,  Surgical consent,  Pre-op evaluation,  At surgeon's request and post-op pain management  Laterality: Right  Prep: chloraprep       Needles:   Needle Type: Echogenic Stimulator Needle          Additional Needles:   Procedures: Doppler guided,,,, ultrasound used (permanent image in chart),,    Narrative:  Start time: 12/14/2021 7:15 AM End time: 12/14/2021 7:30 AM Injection made incrementally with aspirations every 5 mL.  Performed by: Personally  Anesthesiologist: Dorris Singh, MD

## 2021-12-14 NOTE — Progress Notes (Signed)
Pt currently in phase 2. Pt alert and stable. Awaiting her mother to come and pick her up.

## 2021-12-14 NOTE — Discharge Instructions (Signed)
° ° °   Discharge Instructions    Attending Surgeon: [name], MD Office Phone Number: [phone]   Diagnosis and Procedures:    Surgeries Performed: Right knee debridement  Discharge Plan:    Diet: Resume usual diet. Begin with light or bland foods.  Drink plenty of fluids.  Activity:  Weight bearing as tolerated. Please keep your brace locked until follow-up. You are advised to go home directly from the hospital or surgical center. Restrict your activities.  GENERAL INSTRUCTIONS: 1.  Keep your surgical site elevated above your heart for at least 5-7 days or longer to prevent swelling. This will improve your comfort and your overall recovery following surgery.     2. Please call Dr. Serena Croissant office at [phone] with questions Monday-Friday during business hours. If no one answers, please leave a message and someone should get back to the patient within 24 hours. For emergencies please call 911 or proceed to the emergency room.   3. Patient to notify surgical team if experiences any of the following: Bowel/Bladder dysfunction, uncontrolled pain, nerve/muscle weakness, incision with increased drainage or redness, nausea/vomiting and Fever greater than 101.0 F.  Be alert for signs of infection including redness, streaking, odor, fever or chills. Be alert for excessive pain or bleeding and notify your surgeon immediately.  WOUND INSTRUCTIONS:   Leave your dressing/cast/splint in place until your post operative visit.  Keep it clean and dry.  Always keep the incision clean and dry until the staples/sutures are removed. If there is no drainage from the incision you should keep it open to air. If there is drainage from the incision you must keep it covered at all times until the drainage stops  Do not soak in a bath tub, hot tub, pool, lake or other body of water until 21 days after your surgery and your incision is completely dry and healed.  If you have removable sutures  (or staples) they must be removed 10-14 days (unless otherwise instructed) from the day of your surgery.     1)  Elevate the extremity as much as possible.  2)  Keep the dressing clean and dry.  3)  Please call us if the dressing becomes wet or dirty.  4)  If you are experiencing worsening pain or worsening swelling, please call.     MEDICATIONS: Resume all previous home medications at the previous prescribed dose and frequency unless otherwise noted Start taking the  pain medications on an as-needed basis as prescribed  Please taper down pain medication over the next week following surgery.  Ideally you should not require a refill of any narcotic pain medication.  Take pain medication with food to minimize nausea. In addition to the prescribed pain medication, you may take over-the-counter pain relievers such as Tylenol.  Do NOT take additional tylenol if your pain medication already has tylenol in it.  Aspirin 325mg  daily for four weeks.      FOLLOWUP INSTRUCTIONS: 1. Follow up at the Physical Therapy Clinic 3-4 days following surgery. This appointment should be scheduled unless other arrangements have been made.The Physical Therapy scheduling number is [phone] if an appointment has not already been arranged.  2. Contact Dr. [phone] office during office hours at [phone] or the practice after hours line at [phone] for non-emergencies. For medical emergencies call 911.   Discharge Location: Home

## 2021-12-14 NOTE — Anesthesia Preprocedure Evaluation (Signed)
Anesthesia Evaluation  Patient identified by MRN, date of birth, ID band Patient awake    Reviewed: Allergy & Precautions, NPO status   Airway Mallampati: II  TM Distance: >3 FB     Dental   Pulmonary    breath sounds clear to auscultation       Cardiovascular negative cardio ROS   Rhythm:Regular Rate:Normal     Neuro/Psych    GI/Hepatic negative GI ROS, Neg liver ROS,   Endo/Other  negative endocrine ROS  Renal/GU negative Renal ROS     Musculoskeletal   Abdominal   Peds  Hematology  (+) Blood dyscrasia, Sickle cell trait ,   Anesthesia Other Findings   Reproductive/Obstetrics                             Anesthesia Physical Anesthesia Plan  ASA: 2  Anesthesia Plan: General   Post-op Pain Management:    Induction: Intravenous  PONV Risk Score and Plan: 4 or greater and Ondansetron, Dexamethasone and Midazolam  Airway Management Planned: LMA  Additional Equipment:   Intra-op Plan:   Post-operative Plan: Extubation in OR  Informed Consent: I have reviewed the patients History and Physical, chart, labs and discussed the procedure including the risks, benefits and alternatives for the proposed anesthesia with the patient or authorized representative who has indicated his/her understanding and acceptance.     Dental advisory given  Plan Discussed with: CRNA and Anesthesiologist  Anesthesia Plan Comments:         Anesthesia Quick Evaluation

## 2021-12-14 NOTE — H&P (Signed)
Post Operative Evaluation      Procedure/Date of Surgery: 08/31/21 right knee MPFL reconstruction and TTO   Interval History:    11/13/2021: Presents today for follow-up of the above procedure.  Overall the knee does feel very solid.  She is working impressively on strengthening and range of motion although this is seem to have hit a plateau.  She has full extension although it is very limited at 90 degrees of flexion and beyond.  This is leading to lower back pain as well as trouble with stairs   PMH/PSH/Family History/Social History/Meds/Allergies:         Past Medical History:  Diagnosis Date   Anemia     Migraines     Scoliosis           Past Surgical History:  Procedure Laterality Date   CYST REMOVAL LEG       KNEE ARTHROSCOPY Right 08/31/2021    Procedure: ARTHROSCOPY KNEE;  Surgeon: [name], MD;  Location: [facility] OR;  Service: Orthopedics;  Laterality: Right;   MEDIAL PATELLOFEMORAL LIGAMENT REPAIR Right 08/31/2021    Procedure: RIGHT MEDIAL PATELLAFEMORAL RECONSTRUCTION WITH TIBIAL TUBERCLE OSTEOTOMY, RIGHT KNEE ARTHROSCOPY LATERAL MENISCAL DEBRIDEMENT;  Surgeon: [name], MD;  Location: [facility] OR;  Service: Orthopedics;  Laterality: Right;    Social History         Socioeconomic History   Marital status: Single      Spouse name: Not on file   Number of children: Not on file   Years of education: Not on file   Highest education level: Not on file  Occupational History   Occupation: [employer]  Tobacco Use   Smoking status: Never   Smokeless tobacco: Never  Vaping Use   Vaping Use: Never used  Substance and Sexual Activity   Alcohol use: Not Currently   Drug use: No   Sexual activity: Yes      Birth control/protection: Pill, Implant  Other Topics Concern   Not on file  Social History Narrative   Not on file    Social Determinants of Health    Financial Resource Strain: Not on file  Food Insecurity: Not on file   Transportation Needs: Not on file  Physical Activity: Not on file  Stress: Not on file  Social Connections: Not on file         Family History  Problem Relation Age of Onset   Diabetes Mother      No Known Allergies       Current Outpatient Medications  Medication Sig Dispense Refill   acetaminophen (TYLENOL) 500 MG tablet Take 500 mg by mouth every 6 (six) hours as needed for headache.       aspirin EC 325 MG tablet Take 1 tablet (325 mg total) by mouth daily. (Patient not taking: No sig reported) 30 tablet 0   bacitracin ointment Apply 1 application topically 2 (two) times daily. (Patient not taking: No sig reported) 120 g 0   etonogestrel-ethinyl estradiol (NUVARING) 0.12-0.015 MG/24HR vaginal ring Place 1 each vaginally.       ibuprofen (ADVIL) 800 MG tablet Take 800 mg by mouth every 8 (eight) hours as needed for headache.       naproxen (NAPROSYN) 500 MG tablet Take 1 tablet (500 mg total) by mouth every 12 (twelve) hours as needed for moderate pain or mild pain. 30 tablet 0   oxyCODONE (OXY IR/ROXICODONE) 5 MG immediate release tablet Take 1 tablet (5 mg total) by  mouth every 4 (four) hours as needed (severe pain). 20 tablet 0   phenol (CHLORASEPTIC) 1.4 % LIQD Use as directed 1 spray in the mouth or throat as needed for throat irritation / pain. 20 mL 0   scopolamine (TRANSDERM-SCOP) 1 MG/3DAYS Place 1 patch (1.5 mg total) onto the skin every 3 (three) days. 10 patch 12    No current facility-administered medications for this visit.    Imaging Results (Last 48 hours)  No results found.     Review of Systems:   A ROS was performed including pertinent positives and negatives as documented in the HPI.     Musculoskeletal Exam:     There were no vitals taken for this visit.   Right knee incisions are well-healing.  Patella is well centralized.  Internal Imaging:   X-ray right knee 3 views Status post tibial tubercle osteotomy without evidence of complication   I  personally reviewed and interpreted the radiographs.     Assessment:   26 year old female 10-week status post right MPFL reconstruction with tibial tubercle osteotomy.  She is now out of her brace and is working on strengthening and range of motion of the knee.  That being said I do believe that there is somewhat restricted range of motion of the knee to 90 degrees of flexion only as result of arthrofibrosis.  At this time I have offered her a arthroscopic lysis of adhesions and manipulation under anesthesia so that we may allow her to regain her terminal motion.  She understands this and would like to proceed.  We did discuss that she may ultimately be weightbearing as tolerated following this.  I would like her to participate in physical therapy 2 days postop which she will schedule as this will optimize her range of motion from this procedure Plan :     -Plan for right knee arthroscopic lysis of adhesions with manipulation under anesthesia       After a lengthy discussion of treatment options, including risks, benefits, alternatives, complications of surgical and nonsurgical conservative options, the patient elected surgical repair.    The patient  is aware of the material risks  and complications including, but not limited to injury to adjacent structures, neurovascular injury, infection, numbness, bleeding, implant failure, thermal burns, stiffness, persistent pain, failure to heal, disease transmission from allograft, need for further surgery, dislocation, anesthetic risks, blood clots, risks of death,and others. The probabilities of surgical success and failure discussed with patient given their particular co-morbidities.The time and nature of expected rehabilitation and recovery was discussed.The patient's questions were all answered preoperatively.  No barriers to understanding were noted. I explained the natural history of the disease process and Rx rationale.  I explained to the patient what  I considered to be reasonable expectations given their personal situation.  The final treatment plan was arrived at through a shared patient decision making process model.

## 2021-12-14 NOTE — Transfer of Care (Signed)
Immediate Anesthesia Transfer of Care Note  Patient: Sharon Powell  Procedure(s) Performed: Right ARTHROSCOPY KNEE Lysis of adhesions with munipulation under anesthesia (Right: Knee)  Patient Location: PACU  Anesthesia Type:GA combined with regional for post-op pain  Level of Consciousness: awake, alert  and oriented  Airway & Oxygen Therapy: Patient Spontanous Breathing and Patient connected to nasal cannula oxygen  Post-op Assessment: Report given to RN, Post -op Vital signs reviewed and stable, Patient moving all extremities X 4 and Patient able to stick tongue midline  Post vital signs: Reviewed  Last Vitals:  Vitals Value Taken Time  BP 123/80 12/14/21 0851  Temp 36.4 C 12/14/21 0851  Pulse 88 12/14/21 0853  Resp 21 12/14/21 0853  SpO2 100 % 12/14/21 0853  Vitals shown include unvalidated device data.  Last Pain:  Vitals:   12/14/21 0659  TempSrc:   PainSc: 4          Complications: No notable events documented.

## 2021-12-14 NOTE — Progress Notes (Addendum)
Patient states that her mother will be picking her up after the procedure and that her mother will remain with her at all times for the first 24 hours.  Patient was educated on the importance of having a responsible adult with her at all times after the procedure.  Patient verbalized understanding.   Consuello Closs Kavin Leech (mother) can be reached at [phone].  Mother called and verified that she will be picking up patient after the procedure and daughter will stay with her after the surgery.

## 2021-12-15 ENCOUNTER — Encounter (HOSPITAL_COMMUNITY): Payer: Self-pay | Admitting: Orthopaedic Surgery

## 2021-12-18 ENCOUNTER — Telehealth: Payer: Self-pay | Admitting: Orthopaedic Surgery

## 2021-12-18 ENCOUNTER — Ambulatory Visit (HOSPITAL_BASED_OUTPATIENT_CLINIC_OR_DEPARTMENT_OTHER): Payer: Self-pay | Attending: Orthopaedic Surgery | Admitting: Physical Therapy

## 2021-12-18 ENCOUNTER — Other Ambulatory Visit: Payer: Self-pay

## 2021-12-18 ENCOUNTER — Encounter (HOSPITAL_BASED_OUTPATIENT_CLINIC_OR_DEPARTMENT_OTHER): Payer: Self-pay | Admitting: Physical Therapy

## 2021-12-18 DIAGNOSIS — M25661 Stiffness of right knee, not elsewhere classified: Secondary | ICD-10-CM | POA: Insufficient documentation

## 2021-12-18 DIAGNOSIS — M6281 Muscle weakness (generalized): Secondary | ICD-10-CM | POA: Insufficient documentation

## 2021-12-18 DIAGNOSIS — R600 Localized edema: Secondary | ICD-10-CM | POA: Insufficient documentation

## 2021-12-18 DIAGNOSIS — M24661 Ankylosis, right knee: Secondary | ICD-10-CM | POA: Insufficient documentation

## 2021-12-18 DIAGNOSIS — R262 Difficulty in walking, not elsewhere classified: Secondary | ICD-10-CM | POA: Insufficient documentation

## 2021-12-18 DIAGNOSIS — M25561 Pain in right knee: Secondary | ICD-10-CM | POA: Insufficient documentation

## 2021-12-18 NOTE — Telephone Encounter (Signed)
[employer] forms received. To [employer]. 
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
## 2021-12-19 ENCOUNTER — Ambulatory Visit (HOSPITAL_BASED_OUTPATIENT_CLINIC_OR_DEPARTMENT_OTHER): Payer: Self-pay | Admitting: Physical Therapy

## 2021-12-22 ENCOUNTER — Ambulatory Visit (HOSPITAL_BASED_OUTPATIENT_CLINIC_OR_DEPARTMENT_OTHER): Payer: Self-pay | Admitting: Physical Therapy

## 2021-12-22 ENCOUNTER — Encounter (HOSPITAL_BASED_OUTPATIENT_CLINIC_OR_DEPARTMENT_OTHER): Payer: Self-pay | Admitting: Physical Therapy

## 2021-12-22 ENCOUNTER — Other Ambulatory Visit: Payer: Self-pay

## 2021-12-22 DIAGNOSIS — M25661 Stiffness of right knee, not elsewhere classified: Secondary | ICD-10-CM

## 2021-12-22 DIAGNOSIS — M6281 Muscle weakness (generalized): Secondary | ICD-10-CM

## 2021-12-22 DIAGNOSIS — M25561 Pain in right knee: Secondary | ICD-10-CM

## 2021-12-22 DIAGNOSIS — R262 Difficulty in walking, not elsewhere classified: Secondary | ICD-10-CM
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
## 2021-12-28 ENCOUNTER — Ambulatory Visit (INDEPENDENT_AMBULATORY_CARE_PROVIDER_SITE_OTHER): Payer: 59 | Admitting: Orthopaedic Surgery

## 2021-12-28 ENCOUNTER — Other Ambulatory Visit: Payer: Self-pay

## 2021-12-28 DIAGNOSIS — M24661 Ankylosis, right knee: Secondary | ICD-10-CM

## 2021-12-28 DIAGNOSIS — S83001A Unspecified subluxation of right patella, initial encounter: Secondary | ICD-10-CM
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
## 2022-01-01 ENCOUNTER — Encounter (HOSPITAL_BASED_OUTPATIENT_CLINIC_OR_DEPARTMENT_OTHER): Payer: Self-pay | Admitting: Orthopaedic Surgery

## 2022-01-01 ENCOUNTER — Ambulatory Visit (HOSPITAL_BASED_OUTPATIENT_CLINIC_OR_DEPARTMENT_OTHER): Payer: Self-pay | Attending: Orthopaedic Surgery | Admitting: Physical Therapy

## 2022-01-01 ENCOUNTER — Encounter (HOSPITAL_BASED_OUTPATIENT_CLINIC_OR_DEPARTMENT_OTHER): Payer: Self-pay | Admitting: Physical Therapy

## 2022-01-01 ENCOUNTER — Telehealth: Payer: Self-pay | Admitting: Orthopaedic Surgery

## 2022-01-01 ENCOUNTER — Other Ambulatory Visit: Payer: Self-pay

## 2022-01-01 DIAGNOSIS — M6281 Muscle weakness (generalized): Secondary | ICD-10-CM

## 2022-01-01 DIAGNOSIS — R262 Difficulty in walking, not elsewhere classified: Secondary | ICD-10-CM

## 2022-01-01 DIAGNOSIS — R600 Localized edema: Secondary | ICD-10-CM

## 2022-01-01 DIAGNOSIS — M25661 Stiffness of right knee, not elsewhere classified: Secondary | ICD-10-CM

## 2022-01-01 DIAGNOSIS — M25561 Pain in right knee: Secondary | ICD-10-CM

## 2022-01-01 NOTE — Telephone Encounter (Signed)
Faxed to [employer].
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
## 2022-01-01 NOTE — Telephone Encounter (Signed)
Can you do an oow note through next appt 3/17? Last work note indicates 3/15 and benefits will term before her next appt. Thanks.  ?

## 2022-01-03 ENCOUNTER — Ambulatory Visit (HOSPITAL_BASED_OUTPATIENT_CLINIC_OR_DEPARTMENT_OTHER): Payer: Self-pay | Admitting: Physical Therapy

## 2022-01-03 ENCOUNTER — Other Ambulatory Visit: Payer: Self-pay

## 2022-01-03 ENCOUNTER — Encounter (HOSPITAL_BASED_OUTPATIENT_CLINIC_OR_DEPARTMENT_OTHER): Payer: Self-pay | Admitting: Physical Therapy

## 2022-01-03 DIAGNOSIS — M6281 Muscle weakness (generalized): Secondary | ICD-10-CM

## 2022-01-03 DIAGNOSIS — M25561 Pain in right knee: Secondary | ICD-10-CM

## 2022-01-03 DIAGNOSIS — R262 Difficulty in walking, not elsewhere classified: Secondary | ICD-10-CM

## 2022-01-03 DIAGNOSIS — M25661 Stiffness of right knee, not elsewhere classified: Secondary | ICD-10-CM

## 2022-01-03 DIAGNOSIS — R600 Localized edema: Secondary | ICD-10-CM
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
## 2022-01-05 ENCOUNTER — Other Ambulatory Visit: Payer: Self-pay

## 2022-01-05 ENCOUNTER — Ambulatory Visit (HOSPITAL_BASED_OUTPATIENT_CLINIC_OR_DEPARTMENT_OTHER): Payer: Self-pay | Admitting: Physical Therapy

## 2022-01-05 DIAGNOSIS — M6281 Muscle weakness (generalized): Secondary | ICD-10-CM

## 2022-01-05 DIAGNOSIS — R262 Difficulty in walking, not elsewhere classified: Secondary | ICD-10-CM

## 2022-01-05 DIAGNOSIS — M25661 Stiffness of right knee, not elsewhere classified: Secondary | ICD-10-CM

## 2022-01-05 DIAGNOSIS — M25561 Pain in right knee: Secondary | ICD-10-CM

## 2022-01-05 DIAGNOSIS — R600 Localized edema: Secondary | ICD-10-CM
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
## 2022-01-08 ENCOUNTER — Encounter (HOSPITAL_BASED_OUTPATIENT_CLINIC_OR_DEPARTMENT_OTHER): Payer: Self-pay | Admitting: Physical Therapy

## 2022-01-08 ENCOUNTER — Ambulatory Visit (HOSPITAL_BASED_OUTPATIENT_CLINIC_OR_DEPARTMENT_OTHER): Payer: Self-pay | Admitting: Physical Therapy

## 2022-01-08 ENCOUNTER — Other Ambulatory Visit: Payer: Self-pay

## 2022-01-08 DIAGNOSIS — M6281 Muscle weakness (generalized): Secondary | ICD-10-CM

## 2022-01-08 DIAGNOSIS — M25561 Pain in right knee: Secondary | ICD-10-CM

## 2022-01-08 DIAGNOSIS — R262 Difficulty in walking, not elsewhere classified: Secondary | ICD-10-CM

## 2022-01-08 DIAGNOSIS — M25661 Stiffness of right knee, not elsewhere classified: Secondary | ICD-10-CM
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
## 2022-01-10 ENCOUNTER — Ambulatory Visit (HOSPITAL_BASED_OUTPATIENT_CLINIC_OR_DEPARTMENT_OTHER): Payer: Self-pay | Admitting: Physical Therapy

## 2022-01-10 ENCOUNTER — Other Ambulatory Visit: Payer: Self-pay

## 2022-01-10 ENCOUNTER — Encounter (HOSPITAL_BASED_OUTPATIENT_CLINIC_OR_DEPARTMENT_OTHER): Payer: Self-pay

## 2022-01-12 ENCOUNTER — Other Ambulatory Visit: Payer: Self-pay

## 2022-01-12 ENCOUNTER — Ambulatory Visit (INDEPENDENT_AMBULATORY_CARE_PROVIDER_SITE_OTHER): Payer: Self-pay | Admitting: Orthopaedic Surgery

## 2022-01-12 ENCOUNTER — Encounter (HOSPITAL_BASED_OUTPATIENT_CLINIC_OR_DEPARTMENT_OTHER): Payer: Self-pay | Admitting: Physical Therapy

## 2022-01-12 ENCOUNTER — Encounter (HOSPITAL_BASED_OUTPATIENT_CLINIC_OR_DEPARTMENT_OTHER): Payer: Medicaid Other | Admitting: Orthopaedic Surgery

## 2022-01-12 ENCOUNTER — Ambulatory Visit (HOSPITAL_BASED_OUTPATIENT_CLINIC_OR_DEPARTMENT_OTHER): Payer: Self-pay | Admitting: Physical Therapy

## 2022-01-12 DIAGNOSIS — M25561 Pain in right knee: Secondary | ICD-10-CM

## 2022-01-12 DIAGNOSIS — M6281 Muscle weakness (generalized): Secondary | ICD-10-CM

## 2022-01-12 DIAGNOSIS — S83001A Unspecified subluxation of right patella, initial encounter: Secondary | ICD-10-CM

## 2022-01-12 DIAGNOSIS — M24661 Ankylosis, right knee: Secondary | ICD-10-CM

## 2022-01-12 DIAGNOSIS — R262 Difficulty in walking, not elsewhere classified: Secondary | ICD-10-CM

## 2022-01-12 DIAGNOSIS — M25661 Stiffness of right knee, not elsewhere classified: Secondary | ICD-10-CM

## 2022-01-12 NOTE — Progress Notes (Signed)
? ?                            ? ? ?Post Operative Evaluation ?  ? ?Procedure/Date of Surgery: 08/31/21 right knee MPFL reconstruction and TTO with subsequent lysis of adhesions and manipulation under anesthesia done on 12/14/21 ? ?Interval History:  ? ?01/12/2022: Presents today for follow-up.  She is overall doing extremely well.  She has been able to progress to a new record of flexion of 125 degrees.  Overall she has no knee pain and she is very satisfied with her improvement. ? ?PMH/PSH/Family History/Social History/Meds/Allergies:   ? ?Past Medical History:  ?Diagnosis Date  ? Anemia   ? Anxiety   ? Closed head injury with concussion   ? and loss of consciousness  ? Migraines   ? Scoliosis   ? ?Past Surgical History:  ?Procedure Laterality Date  ? CYST REMOVAL LEG    ? KNEE ARTHROSCOPY Right 08/31/2021  ? Procedure: ARTHROSCOPY KNEE;  Surgeon: [name], MD;  Location: [facility] OR;  Service: Orthopedics;  Laterality: Right;  ? KNEE ARTHROSCOPY Right 12/14/2021  ? Procedure: Right ARTHROSCOPY KNEE Lysis of adhesions with munipulation under anesthesia;  Surgeon: [name], MD;  Location: [facility] OR;  Service: Orthopedics;  Laterality: Right;  ? MEDIAL PATELLOFEMORAL LIGAMENT REPAIR Right 08/31/2021  ? Procedure: RIGHT MEDIAL PATELLAFEMORAL RECONSTRUCTION WITH TIBIAL TUBERCLE OSTEOTOMY, RIGHT KNEE ARTHROSCOPY LATERAL MENISCAL DEBRIDEMENT;  Surgeon: [name], MD;  Location: [facility] OR;  Service: Orthopedics;  Laterality: Right;  ? ?Social History  ? ?Socioeconomic History  ? Marital status: Single  ?  Spouse name: Not on file  ? Number of children: Not on file  ? Years of education: Not on file  ? Highest education level: Not on file  ?Occupational History  ? Occupation: [employer]  ?Tobacco Use  ? Smoking status: Never  ? Smokeless tobacco: Never  ?Vaping Use  ? Vaping Use: Never used  ?Substance and Sexual Activity  ? Alcohol use: Not Currently  ? Drug use: No  ? Sexual activity: Yes  ?  Birth control/protection:  Pill, Implant  ?Other Topics Concern  ? Not on file  ?Social History Narrative  ? Not on file  ? ?Social Determinants of Health  ? ?Financial Resource Strain: Not on file  ?Food Insecurity: Not on file  ?Transportation Needs: Not on file  ?Physical Activity: Not on file  ?Stress: Not on file  ?Social Connections: Not on file  ? ?Family History  ?Problem Relation Age of Onset  ? Diabetes Mother   ? ?No Known Allergies ?Current Outpatient Medications  ?Medication Sig Dispense Refill  ? acetaminophen (TYLENOL) 500 MG tablet Take 500 mg by mouth every 6 (six) hours as needed for headache.    ? aspirin EC 325 MG tablet Take 1 tablet (325 mg total) by mouth daily. (Patient not taking: Reported on 12/03/2021) 14 tablet 0  ? bacitracin ointment Apply 1 application topically 2 (two) times daily. (Patient not taking: Reported on 08/28/2021) 120 g 0  ? etonogestrel-ethinyl estradiol (NUVARING) 0.12-0.015 MG/24HR vaginal ring Place 1 each vaginally.    ? naproxen (NAPROSYN) 500 MG tablet Take 1 tablet (500 mg total) by mouth every 12 (twelve) hours as needed for moderate pain or mild pain. 30 tablet 0  ? oxyCODONE (OXY IR/ROXICODONE) 5 MG immediate release tablet Take 1 tablet (5 mg total) by mouth every 4 (four) hours as needed (severe pain). (Patient not taking:  Reported on 12/03/2021) 20 tablet 0  ? oxyCODONE (OXY IR/ROXICODONE) 5 MG immediate release tablet Take 1 tablet (5 mg total) by mouth every 4 (four) hours as needed (severe pain). (Patient not taking: Reported on 12/03/2021) 20 tablet 0  ? phenol (CHLORASEPTIC) 1.4 % LIQD Use as directed 1 spray in the mouth or throat as needed for throat irritation / pain. 20 mL 0  ? scopolamine (TRANSDERM-SCOP) 1 MG/3DAYS Place 1 patch (1.5 mg total) onto the skin every 3 (three) days. (Patient not taking: Reported on 12/03/2021) 10 patch 12  ? ?No current facility-administered medications for this visit.  ? ?No results found. ? ?Review of Systems:   ?A ROS was performed including  pertinent positives and negatives as documented in the HPI. ? ? ?Musculoskeletal Exam:   ? ?There were no vitals taken for this visit. ? ?Right knee incisions are well-healing.  Patella is well centralized.  Range of motion is from 0 degrees to 125.  Negative patella apprehension ?Imaging:   ?X-ray right knee 3 views ?Status post tibial tubercle osteotomy without evidence of complication ? ?I personally reviewed and interpreted the radiographs. ? ? ?Assessment:   ?26 year old female status post right knee MPFL reconstruction with tibial tubercle osteotomy she subsequently underwent a right knee arthroscopic lysis of adhesion.  She has been working with Hessie Diener on progressing her range of motion is making significant improvement.  Overall very satisfied with the quality of her improvement.  She returned to clinic in 2 months.  Return to work note provided ?Plan :   ? ?-Return to clinic in 2 months ? ? ? ? ? ?I personally saw and evaluated the patient, and participated in the management and treatment plan. ? ?[name], MD ?Attending Physician, Orthopedic Surgery ? ?This document was dictated using Conservation officer, historic buildings. A reasonable attempt at proof reading has been made to minimize errors. ? ?
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
## 2022-01-15 ENCOUNTER — Ambulatory Visit (HOSPITAL_BASED_OUTPATIENT_CLINIC_OR_DEPARTMENT_OTHER): Payer: Self-pay | Admitting: Physical Therapy

## 2022-01-15 ENCOUNTER — Encounter (HOSPITAL_BASED_OUTPATIENT_CLINIC_OR_DEPARTMENT_OTHER): Payer: Self-pay | Admitting: Physical Therapy

## 2022-01-15 ENCOUNTER — Other Ambulatory Visit: Payer: Self-pay

## 2022-01-15 DIAGNOSIS — M6281 Muscle weakness (generalized): Secondary | ICD-10-CM

## 2022-01-15 DIAGNOSIS — M25561 Pain in right knee: Secondary | ICD-10-CM

## 2022-01-15 DIAGNOSIS — M25661 Stiffness of right knee, not elsewhere classified: Secondary | ICD-10-CM

## 2022-01-15 DIAGNOSIS — R600 Localized edema: Secondary | ICD-10-CM

## 2022-01-15 DIAGNOSIS — R262 Difficulty in walking, not elsewhere classified: Secondary | ICD-10-CM
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
## 2022-01-17 ENCOUNTER — Ambulatory Visit (HOSPITAL_BASED_OUTPATIENT_CLINIC_OR_DEPARTMENT_OTHER): Payer: Self-pay | Admitting: Physical Therapy

## 2022-01-17 ENCOUNTER — Telehealth: Payer: Self-pay | Admitting: Orthopaedic Surgery

## 2022-01-17 ENCOUNTER — Other Ambulatory Visit: Payer: Self-pay

## 2022-01-17 ENCOUNTER — Encounter (HOSPITAL_BASED_OUTPATIENT_CLINIC_OR_DEPARTMENT_OTHER): Payer: Self-pay | Admitting: Physical Therapy

## 2022-01-17 DIAGNOSIS — M25661 Stiffness of right knee, not elsewhere classified: Secondary | ICD-10-CM

## 2022-01-17 DIAGNOSIS — M6281 Muscle weakness (generalized): Secondary | ICD-10-CM

## 2022-01-17 DIAGNOSIS — R262 Difficulty in walking, not elsewhere classified: Secondary | ICD-10-CM

## 2022-01-17 DIAGNOSIS — M25561 Pain in right knee: Secondary | ICD-10-CM

## 2022-01-17 DIAGNOSIS — R600 Localized edema: Secondary | ICD-10-CM

## 2022-01-17 NOTE — Telephone Encounter (Signed)
[employer] forms received. To [employer]. 
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
## 2022-01-19 ENCOUNTER — Telehealth: Payer: Self-pay | Admitting: Orthopaedic Surgery

## 2022-01-19 ENCOUNTER — Ambulatory Visit (HOSPITAL_BASED_OUTPATIENT_CLINIC_OR_DEPARTMENT_OTHER): Payer: Self-pay | Admitting: Physical Therapy

## 2022-01-19 NOTE — Telephone Encounter (Signed)
Patient called. She would like to speak with Sharon Powell. Would not give a reason why. Her call back number is [phone] ?

## 2022-01-22 ENCOUNTER — Ambulatory Visit (HOSPITAL_BASED_OUTPATIENT_CLINIC_OR_DEPARTMENT_OTHER): Payer: Self-pay | Admitting: Physical Therapy

## 2022-01-22 NOTE — Telephone Encounter (Signed)
LMOM to return call.

## 2022-01-23 NOTE — Telephone Encounter (Signed)
LMOM again for patient to return call.

## 2022-01-24 ENCOUNTER — Ambulatory Visit (HOSPITAL_BASED_OUTPATIENT_CLINIC_OR_DEPARTMENT_OTHER): Payer: Self-pay | Admitting: Physical Therapy

## 2022-01-24 ENCOUNTER — Encounter (HOSPITAL_BASED_OUTPATIENT_CLINIC_OR_DEPARTMENT_OTHER): Payer: Self-pay | Admitting: Physical Therapy

## 2022-01-24 DIAGNOSIS — R262 Difficulty in walking, not elsewhere classified: Secondary | ICD-10-CM

## 2022-01-24 DIAGNOSIS — M25561 Pain in right knee: Secondary | ICD-10-CM

## 2022-01-24 DIAGNOSIS — R600 Localized edema: Secondary | ICD-10-CM

## 2022-01-24 DIAGNOSIS — M6281 Muscle weakness (generalized): Secondary | ICD-10-CM

## 2022-01-24 DIAGNOSIS — M25661 Stiffness of right knee, not elsewhere classified: Secondary | ICD-10-CM
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
## 2022-01-26 ENCOUNTER — Telehealth: Payer: Self-pay | Admitting: Orthopaedic Surgery

## 2022-01-26 ENCOUNTER — Ambulatory Visit (HOSPITAL_BASED_OUTPATIENT_CLINIC_OR_DEPARTMENT_OTHER): Payer: Self-pay | Admitting: Physical Therapy

## 2022-01-26 NOTE — Telephone Encounter (Signed)
Pt is calling regarding Medical forms to be completed, please call the pt as she has not heard anything  ?

## 2022-01-29 ENCOUNTER — Telehealth: Payer: Self-pay | Admitting: Orthopaedic Surgery

## 2022-01-29 NOTE — Telephone Encounter (Signed)
01/12/22 work note faxed to [employer] [phone] ?

## 2022-01-29 NOTE — Telephone Encounter (Signed)
Spoke with patient she advised her employer need a more detail message stating all restrictions before she can return to work.  The number to contact patient is [phone] ?

## 2022-01-29 NOTE — Telephone Encounter (Signed)
Patient said she had disability forms needed for her return to work. I informed her she could take them to the [address] [facility] office to be filled out and submitted.  ?

## 2022-01-29 NOTE — Telephone Encounter (Signed)
LMOM for patient to return call.

## 2022-01-31 ENCOUNTER — Ambulatory Visit (HOSPITAL_BASED_OUTPATIENT_CLINIC_OR_DEPARTMENT_OTHER): Payer: 59 | Attending: Orthopaedic Surgery | Admitting: Physical Therapy

## 2022-01-31 ENCOUNTER — Encounter (HOSPITAL_BASED_OUTPATIENT_CLINIC_OR_DEPARTMENT_OTHER): Payer: Self-pay | Admitting: Orthopaedic Surgery

## 2022-01-31 ENCOUNTER — Encounter (HOSPITAL_BASED_OUTPATIENT_CLINIC_OR_DEPARTMENT_OTHER): Payer: Self-pay | Admitting: Physical Therapy

## 2022-01-31 DIAGNOSIS — M25561 Pain in right knee: Secondary | ICD-10-CM | POA: Insufficient documentation

## 2022-01-31 DIAGNOSIS — R600 Localized edema: Secondary | ICD-10-CM | POA: Insufficient documentation

## 2022-01-31 DIAGNOSIS — M25661 Stiffness of right knee, not elsewhere classified: Secondary | ICD-10-CM | POA: Insufficient documentation

## 2022-01-31 DIAGNOSIS — M6281 Muscle weakness (generalized): Secondary | ICD-10-CM | POA: Insufficient documentation

## 2022-01-31 DIAGNOSIS — R262 Difficulty in walking, not elsewhere classified: Secondary | ICD-10-CM | POA: Diagnosis present
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
## 2022-01-31 NOTE — Telephone Encounter (Signed)
Faxed updated work note [phone] ?

## 2022-02-06 ENCOUNTER — Encounter (HOSPITAL_BASED_OUTPATIENT_CLINIC_OR_DEPARTMENT_OTHER): Payer: Self-pay | Admitting: Physical Therapy

## 2022-02-06 ENCOUNTER — Ambulatory Visit (HOSPITAL_BASED_OUTPATIENT_CLINIC_OR_DEPARTMENT_OTHER): Payer: 59 | Admitting: Physical Therapy

## 2022-02-06 DIAGNOSIS — M25661 Stiffness of right knee, not elsewhere classified: Secondary | ICD-10-CM

## 2022-02-06 DIAGNOSIS — M6281 Muscle weakness (generalized): Secondary | ICD-10-CM

## 2022-02-06 DIAGNOSIS — M25561 Pain in right knee: Secondary | ICD-10-CM | POA: Diagnosis not present

## 2022-02-06 DIAGNOSIS — R262 Difficulty in walking, not elsewhere classified: Secondary | ICD-10-CM

## 2022-02-06 DIAGNOSIS — R600 Localized edema: Secondary | ICD-10-CM
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
## 2022-02-14 ENCOUNTER — Ambulatory Visit: Payer: Self-pay | Admitting: Neurology

## 2022-02-14 ENCOUNTER — Ambulatory Visit: Payer: Medicaid Other | Admitting: Neurology

## 2022-02-23 ENCOUNTER — Ambulatory Visit: Payer: 59 | Admitting: Neurology

## 2022-02-26 ENCOUNTER — Telehealth: Payer: Self-pay | Admitting: Orthopaedic Surgery

## 2022-02-26 NOTE — Telephone Encounter (Signed)
CB # [phone] ?

## 2022-02-26 NOTE — Telephone Encounter (Signed)
Dr.Dennis with [facility] called and would like to speak about pt.  ?

## 2022-03-05 ENCOUNTER — Telehealth: Payer: Self-pay | Admitting: Orthopaedic Surgery

## 2022-03-05 NOTE — Telephone Encounter (Addendum)
Patient called asked if she can get a morning appointment with Dr. Steward Drone on 03/07/2022? The number to contact patient is [phone] ?

## 2022-03-07 ENCOUNTER — Ambulatory Visit (INDEPENDENT_AMBULATORY_CARE_PROVIDER_SITE_OTHER): Payer: Self-pay | Admitting: Orthopaedic Surgery

## 2022-03-07 ENCOUNTER — Encounter (HOSPITAL_BASED_OUTPATIENT_CLINIC_OR_DEPARTMENT_OTHER): Payer: Medicaid Other | Admitting: Orthopaedic Surgery

## 2022-03-07 DIAGNOSIS — M24661 Ankylosis, right knee: Secondary | ICD-10-CM

## 2022-03-07 NOTE — Progress Notes (Signed)
? ?                            ? ? ?Post Operative Evaluation ?  ? ?Procedure/Date of Surgery: 08/31/21 right knee MPFL reconstruction and TTO with subsequent lysis of adhesions and manipulation under anesthesia done on 12/14/21 ? ?Interval History:  ? ?03/07/2022: Presents today for follow-up.  She did have a slip off a pull up bar and landed on the right knee recently. She has been able to weight bear following but is sore around her osteotomy site. ? ?PMH/PSH/Family History/Social History/Meds/Allergies:   ? ?Past Medical History:  ?Diagnosis Date  ? Anemia   ? Anxiety   ? Closed head injury with concussion   ? and loss of consciousness  ? Migraines   ? Scoliosis   ? ?Past Surgical History:  ?Procedure Laterality Date  ? CYST REMOVAL LEG    ? KNEE ARTHROSCOPY Right 08/31/2021  ? Procedure: ARTHROSCOPY KNEE;  Surgeon: [name], MD;  Location: [facility] OR;  Service: Orthopedics;  Laterality: Right;  ? KNEE ARTHROSCOPY Right 12/14/2021  ? Procedure: Right ARTHROSCOPY KNEE Lysis of adhesions with munipulation under anesthesia;  Surgeon: [name], MD;  Location: [facility] OR;  Service: Orthopedics;  Laterality: Right;  ? MEDIAL PATELLOFEMORAL LIGAMENT REPAIR Right 08/31/2021  ? Procedure: RIGHT MEDIAL PATELLAFEMORAL RECONSTRUCTION WITH TIBIAL TUBERCLE OSTEOTOMY, RIGHT KNEE ARTHROSCOPY LATERAL MENISCAL DEBRIDEMENT;  Surgeon: [name], MD;  Location: [facility] OR;  Service: Orthopedics;  Laterality: Right;  ? ?Social History  ? ?Socioeconomic History  ? Marital status: Single  ?  Spouse name: Not on file  ? Number of children: Not on file  ? Years of education: Not on file  ? Highest education level: Not on file  ?Occupational History  ? Occupation: [employer]  ?Tobacco Use  ? Smoking status: Never  ? Smokeless tobacco: Never  ?Vaping Use  ? Vaping Use: Never used  ?Substance and Sexual Activity  ? Alcohol use: Not Currently  ? Drug use: No  ? Sexual activity: Yes  ?  Birth control/protection: Pill, Implant  ?Other Topics  Concern  ? Not on file  ?Social History Narrative  ? Not on file  ? ?Social Determinants of Health  ? ?Financial Resource Strain: Not on file  ?Food Insecurity: Not on file  ?Transportation Needs: Not on file  ?Physical Activity: Not on file  ?Stress: Not on file  ?Social Connections: Not on file  ? ?Family History  ?Problem Relation Age of Onset  ? Diabetes Mother   ? ?No Known Allergies ?Current Outpatient Medications  ?Medication Sig Dispense Refill  ? acetaminophen (TYLENOL) 500 MG tablet Take 500 mg by mouth every 6 (six) hours as needed for headache.    ? aspirin EC 325 MG tablet Take 1 tablet (325 mg total) by mouth daily. (Patient not taking: Reported on 12/03/2021) 14 tablet 0  ? bacitracin ointment Apply 1 application topically 2 (two) times daily. (Patient not taking: Reported on 08/28/2021) 120 g 0  ? etonogestrel-ethinyl estradiol (NUVARING) 0.12-0.015 MG/24HR vaginal ring Place 1 each vaginally.    ? naproxen (NAPROSYN) 500 MG tablet Take 1 tablet (500 mg total) by mouth every 12 (twelve) hours as needed for moderate pain or mild pain. 30 tablet 0  ? oxyCODONE (OXY IR/ROXICODONE) 5 MG immediate release tablet Take 1 tablet (5 mg total) by mouth every 4 (four) hours as needed (severe pain). (Patient not taking: Reported on 12/03/2021) 20 tablet  0  ? oxyCODONE (OXY IR/ROXICODONE) 5 MG immediate release tablet Take 1 tablet (5 mg total) by mouth every 4 (four) hours as needed (severe pain). (Patient not taking: Reported on 12/03/2021) 20 tablet 0  ? phenol (CHLORASEPTIC) 1.4 % LIQD Use as directed 1 spray in the mouth or throat as needed for throat irritation / pain. 20 mL 0  ? scopolamine (TRANSDERM-SCOP) 1 MG/3DAYS Place 1 patch (1.5 mg total) onto the skin every 3 (three) days. (Patient not taking: Reported on 12/03/2021) 10 patch 12  ? ?No current facility-administered medications for this visit.  ? ?No results found. ? ?Review of Systems:   ?A ROS was performed including pertinent positives and negatives as  documented in the HPI. ? ? ?Musculoskeletal Exam:   ? ?There were no vitals taken for this visit. ? ?Right knee incisions are well-healing.  Patella is well centralized.  Range of motion is from 0 degrees to 135.  Negative patella apprehension. ? ?Imaging:   ?X-ray right knee 3 views ?Status post tibial tubercle osteotomy without evidence of complication ? ?I personally reviewed and interpreted the radiographs. ? ? ?Assessment:   ?26 year old female status post right knee MPFL reconstruction with tibial tubercle osteotomy she subsequently underwent a right knee arthroscopic lysis of adhesion. She is sore around her TTO site although there is no evidence disruption of this site. At this, we will plan to see her back as needed. ? ?Plan :   ? ?-Return to clinic as needed ? ? ? ? ? ?I personally saw and evaluated the patient, and participated in the management and treatment plan. ? ?[name], MD ?Attending Physician, Orthopedic Surgery ? ?This document was dictated using Conservation officer, historic buildings. A reasonable attempt at proof reading has been made to minimize errors. ? ?

## 2022-03-12 ENCOUNTER — Telehealth (HOSPITAL_BASED_OUTPATIENT_CLINIC_OR_DEPARTMENT_OTHER): Payer: Self-pay | Admitting: Orthopaedic Surgery

## 2022-03-12 ENCOUNTER — Other Ambulatory Visit (HOSPITAL_BASED_OUTPATIENT_CLINIC_OR_DEPARTMENT_OTHER): Payer: Self-pay | Admitting: Orthopaedic Surgery

## 2022-03-12 MED ORDER — METHOCARBAMOL 500 MG PO TABS: 500.0000 mg | ORAL_TABLET | Freq: Two times a day (BID) | ORAL | 3 refills | Status: AC

## 2022-03-12 NOTE — Telephone Encounter (Signed)
Note uploaded to [place] stating she may need additional absences for up to one year for appointments, rest and optimal recovery ?

## 2022-03-30 ENCOUNTER — Ambulatory Visit (INDEPENDENT_AMBULATORY_CARE_PROVIDER_SITE_OTHER): Payer: 59 | Admitting: Neurology

## 2022-03-30 ENCOUNTER — Encounter: Payer: Self-pay | Admitting: Neurology

## 2022-03-30 VITALS — BP 115/76 | HR 80 | Ht 65.0 in | Wt 171.4 lb

## 2022-03-30 DIAGNOSIS — G43709 Chronic migraine without aura, not intractable, without status migrainosus: Secondary | ICD-10-CM | POA: Diagnosis not present

## 2022-03-30 DIAGNOSIS — F419 Anxiety disorder, unspecified: Secondary | ICD-10-CM | POA: Diagnosis not present

## 2022-03-30 MED ORDER — ONDANSETRON HCL 4 MG PO TABS: 4.0000 mg | ORAL_TABLET | Freq: Three times a day (TID) | ORAL | 5 refills | Status: AC | PRN

## 2022-03-30 MED ORDER — NORTRIPTYLINE HCL 25 MG PO CAPS: 25.0000 mg | ORAL_CAPSULE | Freq: Every day | ORAL | 5 refills | Status: AC

## 2022-03-30 MED ORDER — SUMATRIPTAN SUCCINATE 100 MG PO TABS: 100.0000 mg | ORAL_TABLET | ORAL | 5 refills | Status: AC | PRN
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
## 2022-03-30 NOTE — Patient Instructions (Signed)
  Refer to behavioral medicine to see a counselor for anxiety Start nortriptyline 25mg  at bedtime.  Contact in 4 weeks with update and we can increase dose if needed. Take sumatriptan 100mg  at earliest onset of headache.  May repeat dose once in 2 hours if needed.  Maximum 2 tablets in 24 hours.   ONDANSETRON FOR NAUSEA STOP ALL OVER THE COUNTER PAIN RELIEVERS Limit use of pain relievers to no more than 2 days out of the week.  These medications include acetaminophen, NSAIDs (ibuprofen/Advil/Motrin, naproxen/Aleve, triptans (Imitrex/sumatriptan), Excedrin, and narcotics.  This will help reduce risk of rebound headaches. Be aware of common food triggers:  - Caffeine:  coffee, black tea, cola, Mt. Dew  - Chocolate  - Dairy:  aged cheeses (brie, blue, cheddar, gouda, [place], provolone, [place], Swiss, etc), chocolate milk, buttermilk, sour cream, limit eggs and yogurt  - Nuts, peanut butter  - Alcohol  - Cereals/grains:  FRESH breads (fresh bagels, sourdough, doughnuts), yeast productions  - Processed/canned/aged/cured meats (pre-packaged deli meats, hotdogs)  - MSG/glutamate:  soy sauce, flavor enhancer, pickled/preserved/marinated foods  - Sweeteners:  aspartame (Equal, Nutrasweet).  Sugar and Splenda are okay  - Vegetables:  legumes (lima beans, lentils, snow peas, fava beans, pinto peans, peas, garbanzo beans), sauerkraut, onions, olives, pickles  - Fruit:  avocados, bananas, citrus fruit (orange, lemon, grapefruit), mango  - Other:  Frozen meals, macaroni and cheese Routine exercise Stay adequately hydrated (aim for 64 oz water daily) Keep headache diary Maintain proper stress management Maintain proper sleep hygiene Do not skip meals Consider supplements:  magnesium citrate 400mg  daily, riboflavin 400mg  daily, coenzyme Q10 100mg  three times daily.

## 2022-04-03 ENCOUNTER — Telehealth: Payer: Self-pay | Admitting: Orthopaedic Surgery

## 2022-04-03 NOTE — Telephone Encounter (Signed)
Dr.Dennis stephens  Work care called asking about pt leave of work.   CB [phone]

## 2022-04-04 NOTE — Telephone Encounter (Signed)
Spoke to Dr. Maylon Peppers after verbal discussion with Dr. Steward Drone in regards to patient's FMLA paperwork

## 2022-05-03 ENCOUNTER — Telehealth: Payer: Self-pay | Admitting: Neurology

## 2022-05-03 NOTE — Telephone Encounter (Signed)
Patient called to seen if she could get a letter for the tint on her windows being dark for her migraines?    She also stated that she needs her medications refilled.  She stated when she had went to the pharmacy to pick them up they were not there.  She uses [facility] on [address].

## 2022-05-03 NOTE — Telephone Encounter (Signed)
Tried calling patient, someone picked the phone up but they never said anything. I said hello twice no response.  After reviewing patient last visit notes, Dr.Jaffe sent in scripts for nortriptyline sumatriptan and zofran.

## 2022-05-30 ENCOUNTER — Ambulatory Visit: Payer: Self-pay | Admitting: Neurology

## 2022-07-03 ENCOUNTER — Ambulatory Visit: Payer: Medicaid Other | Admitting: Neurology

## 2022-07-11 ENCOUNTER — Telehealth: Payer: Self-pay | Admitting: Neurology

## 2022-07-11 NOTE — Telephone Encounter (Signed)
Patient states that she needs a letter about the tint on her car windows. Please call patient back

## 2022-07-12 NOTE — Telephone Encounter (Signed)
Called patient and left a message for a call back.  

## 2022-07-17 NOTE — Telephone Encounter (Signed)
Called patient and unable to leave a message due to mailbox being full.  

## 2022-07-18 NOTE — Telephone Encounter (Signed)
Called patient and informed her that she will need to go to the [facility] and get the proper form for Dr. Tomi Likens to fill out. Patient is aware that she will need to retrieve this form from the [facility] and bring it in the office for Dr. Tomi Likens to review and fill out.   Patient verbalized understanding and had no further questions or concerns.

## 2022-07-19 ENCOUNTER — Ambulatory Visit: Payer: 59 | Admitting: Psychologist

## 2022-08-01 ENCOUNTER — Ambulatory Visit (HOSPITAL_BASED_OUTPATIENT_CLINIC_OR_DEPARTMENT_OTHER): Payer: 59 | Admitting: Orthopaedic Surgery

## 2022-08-01 ENCOUNTER — Ambulatory Visit (INDEPENDENT_AMBULATORY_CARE_PROVIDER_SITE_OTHER): Payer: 59 | Admitting: Orthopaedic Surgery

## 2022-08-01 DIAGNOSIS — S83001A Unspecified subluxation of right patella, initial encounter: Secondary | ICD-10-CM

## 2022-08-01 NOTE — Progress Notes (Signed)
Post Operative Evaluation    Procedure/Date of Surgery: 08/31/21 right knee MPFL reconstruction and TTO with subsequent lysis of adhesions and manipulation under anesthesia done on 12/14/21  Interval History:   08/01/2022: Sharon Powell presents today for follow-up in the right knee.  She does state that the knee feels occasionally stiff and sore.  There is no component of instability or feeling like the knee is giving out.  She is here today for further assessment.  PMH/PSH/Family History/Social History/Meds/Allergies:    Past Medical History:  Diagnosis Date   Anemia    Anxiety    Closed head injury with concussion    and loss of consciousness   Migraines    Scoliosis    Past Surgical History:  Procedure Laterality Date   CYST REMOVAL LEG     KNEE ARTHROSCOPY Right 08/31/2021   Procedure: ARTHROSCOPY KNEE;  Surgeon: Vanetta Mulders, MD;  Location: [place];  Service: Orthopedics;  Laterality: Right;   KNEE ARTHROSCOPY Right 12/14/2021   Procedure: Right ARTHROSCOPY KNEE Lysis of adhesions with munipulation under anesthesia;  Surgeon: Vanetta Mulders, MD;  Location: [place];  Service: Orthopedics;  Laterality: Right;   MEDIAL PATELLOFEMORAL LIGAMENT REPAIR Right 08/31/2021   Procedure: RIGHT MEDIAL PATELLAFEMORAL RECONSTRUCTION WITH TIBIAL TUBERCLE OSTEOTOMY, RIGHT KNEE ARTHROSCOPY LATERAL MENISCAL DEBRIDEMENT;  Surgeon: Vanetta Mulders, MD;  Location: [place];  Service: Orthopedics;  Laterality: Right;   Social History   Socioeconomic History   Marital status: Single    Spouse name: Not on file   Number of children: Not on file   Years of education: Not on file   Highest education level: Not on file  Occupational History   Occupation: [employer]  Tobacco Use   Smoking status: Never   Smokeless tobacco: Never  Vaping Use   Vaping Use: Never used  Substance and Sexual Activity   Alcohol use: Not Currently   Drug use: No   Sexual activity: Yes    Birth  control/protection: Pill, Implant  Other Topics Concern   Not on file  Social History Narrative   Not on file   Social Determinants of Health   Financial Resource Strain: Not on file  Food Insecurity: Not on file  Transportation Needs: Not on file  Physical Activity: Not on file  Stress: Not on file  Social Connections: Not on file   Family History  Problem Relation Age of Onset   Migraines Mother    Diabetes Mother    Migraines Maternal Aunt    No Known Allergies Current Outpatient Medications  Medication Sig Dispense Refill   acetaminophen (TYLENOL) 500 MG tablet Take 500 mg by mouth every 6 (six) hours as needed for headache.     naproxen (NAPROSYN) 500 MG tablet Take 1 tablet (500 mg total) by mouth every 12 (twelve) hours as needed for moderate pain or mild pain. 30 tablet 0   nortriptyline (PAMELOR) 25 MG capsule Take 1 capsule (25 mg total) by mouth at bedtime. 30 capsule 5   ondansetron (ZOFRAN) 4 MG tablet Take 1 tablet (4 mg total) by mouth every 8 (eight) hours as needed for nausea or vomiting. 20 tablet 5   phenol (CHLORASEPTIC) 1.4 % LIQD Use as directed 1 spray in the mouth or throat as needed for throat irritation / pain. 20 mL 0  scopolamine (TRANSDERM-SCOP) 1 MG/3DAYS Place 1 patch (1.5 mg total) onto the skin every 3 (three) days. (Patient not taking: Reported on 12/03/2021) 10 patch 12   SUMAtriptan (IMITREX) 100 MG tablet Take 1 tablet (100 mg total) by mouth as needed for up to 1 dose for migraine (may repeat after 2 hours.  Maximum 2 tablets in 24 ours.). May repeat in 2 hours if headache persists or recurs. 10 tablet 5   No current facility-administered medications for this visit.   No results found.  Review of Systems:   A ROS was performed including pertinent positives and negatives as documented in the HPI.   Musculoskeletal Exam:    There were no vitals taken for this visit.  Right knee incisions are well-healing.  Patella is well centralized.   Range of motion is from 0 degrees to 135.  Negative patella apprehension.  Imaging:   X-ray right knee 3 views Status post tibial tubercle osteotomy without evidence of complication  I personally reviewed and interpreted the radiographs.   Assessment:   26 year old female status post right knee MPFL reconstruction with tibial tubercle osteotomy she subsequently underwent a right knee arthroscopic lysis of adhesion.  Overall she does have significant weakness in the hip and quad on today's exam.  I do believe that her symptoms are more consistent with this as result of her proximal weakness.  I will plan to provide her with a home-based patellofemoral program that she may begin to utilize  Plan :    -Right knee patellofemoral program provided      I personally saw and evaluated the patient, and participated in the management and treatment plan.  [name], MD Attending Physician, Orthopedic Surgery  This document was dictated using Dragon voice recognition software. A reasonable attempt at proof reading has been made to minimize errors.

## 2022-08-02 ENCOUNTER — Telehealth: Payer: 59 | Admitting: Family Medicine

## 2022-08-02 DIAGNOSIS — R21 Rash and other nonspecific skin eruption: Secondary | ICD-10-CM

## 2022-08-02 DIAGNOSIS — B372 Candidiasis of skin and nail: Secondary | ICD-10-CM

## 2022-08-02 MED ORDER — FLUCONAZOLE 150 MG PO TABS: 150.0000 mg | ORAL_TABLET | ORAL | 0 refills | Status: AC

## 2022-08-02 MED ORDER — NYSTATIN-TRIAMCINOLONE 100000-0.1 UNIT/GM-% EX OINT: 1.0000 | TOPICAL_OINTMENT | Freq: Two times a day (BID) | CUTANEOUS | 0 refills | Status: AC

## 2022-08-02 NOTE — Patient Instructions (Signed)
Laren Boom, thank you for joining Freddy Finner, NP for today's virtual visit.  While this provider is not your primary care provider (PCP), if your PCP is located in our provider database this encounter information will be shared with them immediately following your visit.  Consent: (Patient) Sharon Powell provided verbal consent for this virtual visit at the beginning of the encounter.  Current Medications:  Current Outpatient Medications:    fluconazole (DIFLUCAN) 150 MG tablet, Take 1 tablet (150 mg total) by mouth as directed. Repeat in 4 days if needed, Disp: 2 tablet, Rfl: 0   nystatin-triamcinolone ointment (MYCOLOG), Apply 1 Application topically 2 (two) times daily., Disp: 30 g, Rfl: 0   acetaminophen (TYLENOL) 500 MG tablet, Take 500 mg by mouth every 6 (six) hours as needed for headache., Disp: , Rfl:    naproxen (NAPROSYN) 500 MG tablet, Take 1 tablet (500 mg total) by mouth every 12 (twelve) hours as needed for moderate pain or mild pain., Disp: 30 tablet, Rfl: 0   nortriptyline (PAMELOR) 25 MG capsule, Take 1 capsule (25 mg total) by mouth at bedtime., Disp: 30 capsule, Rfl: 5   ondansetron (ZOFRAN) 4 MG tablet, Take 1 tablet (4 mg total) by mouth every 8 (eight) hours as needed for nausea or vomiting., Disp: 20 tablet, Rfl: 5   phenol (CHLORASEPTIC) 1.4 % LIQD, Use as directed 1 spray in the mouth or throat as needed for throat irritation / pain., Disp: 20 mL, Rfl: 0   scopolamine (TRANSDERM-SCOP) 1 MG/3DAYS, Place 1 patch (1.5 mg total) onto the skin every 3 (three) days. (Patient not taking: Reported on 12/03/2021), Disp: 10 patch, Rfl: 12   SUMAtriptan (IMITREX) 100 MG tablet, Take 1 tablet (100 mg total) by mouth as needed for up to 1 dose for migraine (may repeat after 2 hours.  Maximum 2 tablets in 24 ours.). May repeat in 2 hours if headache persists or recurs., Disp: 10 tablet, Rfl: 5   Medications ordered in this encounter:  Meds ordered this encounter  Medications    fluconazole (DIFLUCAN) 150 MG tablet    Sig: Take 1 tablet (150 mg total) by mouth as directed. Repeat in 4 days if needed    Dispense:  2 tablet    Refill:  0    Order Specific Question:   Supervising Provider    Answer:   Merrilee Jansky [[id number]]   nystatin-triamcinolone ointment (MYCOLOG)    Sig: Apply 1 Application topically 2 (two) times daily.    Dispense:  30 g    Refill:  0    Order Specific Question:   Supervising Provider    Answer:   Merrilee Jansky [id number]     *If you need refills on other medications prior to your next appointment, please contact your pharmacy*  Follow-Up: Call back or seek an in-person evaluation if the symptoms worsen or if the condition fails to improve as anticipated.  [facility] [phone]  Other Instructions Use medications as directed, if not improved be seen in person.  Immediately change clothes post working out and wipe with a damp cloth if you can not shower.    If you have been instructed to have an in-person evaluation today at a local Urgent Care facility, please use the link below. It will take you to a list of all of our available [facility], including address, phone number and hours of operation. Please do not delay care.  [facility]  If you or a family member do not have a primary care provider, use the link below to schedule a visit and establish care. When you choose a [facility] primary care physician or advanced practice provider, you gain a long-term partner in health. Find a Primary Care Provider  Learn more about [facility]'s in-office and virtual care options: [facility]

## 2022-08-02 NOTE — Progress Notes (Signed)
Virtual Visit Consent   [name], you are scheduled for a virtual visit with a [facility] provider today. Just as with appointments in the office, your consent must be obtained to participate. Your consent will be active for this visit and any virtual visit you may have with one of our providers in the next 365 days. If you have a MyChart account, a copy of this consent can be sent to you electronically.  As this is a virtual visit, video technology does not allow for your provider to perform a traditional examination. This may limit your provider's ability to fully assess your condition. If your provider identifies any concerns that need to be evaluated in person or the need to arrange testing (such as labs, EKG, etc.), we will make arrangements to do so. Although advances in technology are sophisticated, we cannot ensure that it will always work on either your end or our end. If the connection with a video visit is poor, the visit may have to be switched to a telephone visit. With either a video or telephone visit, we are not always able to ensure that we have a secure connection.  By engaging in this virtual visit, you consent to the provision of healthcare and authorize for your insurance to be billed (if applicable) for the services provided during this visit. Depending on your insurance coverage, you may receive a charge related to this service.  I need to obtain your verbal consent now. Are you willing to proceed with your visit today? Sharon Powell has provided verbal consent on 08/02/2022 for a virtual visit (video or telephone). Freddy Finner, NP  Date: 08/02/2022 11:05 AM  Virtual Visit via Video Note   I, Freddy Finner, connected with  Sharon Powell  ([id number], [birth year]) on 08/02/22 at 11:00 AM EDT by a video-enabled telemedicine application and verified that I am speaking with the correct person using two identifiers.  Location: Patient: Virtual Visit Location Patient:  Home Provider: Virtual Visit Location Provider: Home Office   I discussed the limitations of evaluation and management by telemedicine and the availability of in person appointments. The patient expressed understanding and agreed to proceed.    History of Present Illness: Sharon Powell is a 26 y.o. who identifies as a female who was assigned female at birth, and is being seen today for rash on skin. Dry patches of skin and redness, some discomfort, mild itching. Denies broken skin. Denies fevers, chills, no new soaps or other hygiene products. No personal history of skin issues.Recently started working out at [employer]   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   Patellar subluxation, right, initial encounter    Tear of lateral meniscus of right knee, current     Allergies: No Known Allergies Medications:  Current Outpatient Medications:    acetaminophen (TYLENOL) 500 MG tablet, Take 500 mg by mouth every 6 (six) hours as needed for headache., Disp: , Rfl:    naproxen (NAPROSYN) 500 MG tablet, Take 1 tablet (500 mg total) by mouth every 12 (twelve) hours as needed for moderate pain or mild pain., Disp: 30 tablet, Rfl: 0   nortriptyline (PAMELOR) 25 MG capsule, Take 1 capsule (25 mg total) by mouth at bedtime., Disp: 30 capsule, Rfl: 5   ondansetron (ZOFRAN) 4 MG tablet, Take 1 tablet (4 mg total) by mouth every 8 (eight) hours as  needed for nausea or vomiting., Disp: 20 tablet, Rfl: 5   phenol (CHLORASEPTIC) 1.4 % LIQD, Use as directed 1 spray in the mouth or throat as needed for throat irritation / pain., Disp: 20 mL, Rfl: 0   scopolamine (TRANSDERM-SCOP) 1 MG/3DAYS, Place 1 patch (1.5 mg total) onto the skin every 3 (three) days. (Patient not taking: Reported on 12/03/2021), Disp: 10 patch, Rfl: 12   SUMAtriptan (IMITREX) 100 MG tablet, Take 1 tablet (100 mg total) by mouth as needed  for up to 1 dose for migraine (may repeat after 2 hours.  Maximum 2 tablets in 24 ours.). May repeat in 2 hours if headache persists or recurs., Disp: 10 tablet, Rfl: 5  Observations/Objective: Patient is well-developed, well-nourished in no acute distress.  Resting comfortably  at home.  Head is normocephalic, atraumatic.  No labored breathing.  Speech is clear and coherent with logical content.  Patient is alert and oriented at baseline.  Dark, dry, redden patches on either side of neck, between breast and under breast.  Assessment and Plan: 1. Rash  - fluconazole (DIFLUCAN) 150 MG tablet; Take 1 tablet (150 mg total) by mouth as directed. Repeat in 4 days if needed  Dispense: 2 tablet; Refill: 0 - nystatin-triamcinolone ointment (MYCOLOG); Apply 1 Application topically 2 (two) times daily.  Dispense: 30 g; Refill: 0  2. Skin yeast infection  - fluconazole (DIFLUCAN) 150 MG tablet; Take 1 tablet (150 mg total) by mouth as directed. Repeat in 4 days if needed  Dispense: 2 tablet; Refill: 0 - nystatin-triamcinolone ointment (MYCOLOG); Apply 1 Application topically 2 (two) times daily.  Dispense: 30 g; Refill: 0  -questionable yeast driven rash given no history outside of recent working out starting up -areas make rash from yeast a etiology -will treat with above and advised on tips for keep yeast from coming back -info on AVS   Reviewed side effects, risks and benefits of medication.    Patient acknowledged agreement and understanding of the plan.   Past Medical, Surgical, Social History, Allergies, and Medications have been Reviewed.    Follow Up Instructions: I discussed the assessment and treatment plan with the patient. The patient was provided an opportunity to ask questions and all were answered. The patient agreed with the plan and demonstrated an understanding of the instructions.  A copy of instructions were sent to the patient via MyChart unless otherwise noted below.      The patient was advised to call back or seek an in-person evaluation if the symptoms worsen or if the condition fails to improve as anticipated.  Time:  I spent 10 minutes with the patient via telehealth technology discussing the above problems/concerns.    Perlie Mayo, NP
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
## 2022-08-09 ENCOUNTER — Ambulatory Visit (INDEPENDENT_AMBULATORY_CARE_PROVIDER_SITE_OTHER): Payer: 59 | Admitting: Psychologist

## 2022-08-09 DIAGNOSIS — F411 Generalized anxiety disorder: Secondary | ICD-10-CM | POA: Diagnosis not present

## 2022-08-09 DIAGNOSIS — F4310 Post-traumatic stress disorder, unspecified: Secondary | ICD-10-CM | POA: Diagnosis not present
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
## 2022-08-09 NOTE — Plan of Care (Signed)
Goals Reduce overall frequency, intensity, and duration of anxiety Stabilize anxiety level wile increasing ability to function Enhance ability to effectively cope with full variety of stressors Learn and implement coping skills that result in a reduction of anxiety   Objectives Verbalize an understanding of the cognitive, physiological, and behavioral components of anxiety Learning and implement calming skills to reduce overall anxiety Verbalize an understanding of the role that cognitive biases play in excessive irrational worry and persistent anxiety symptoms Identify, challenge, and replace based fearful talk Learn and implement problem solving strategies Identify and engage in pleasant activities Learning and implement personal and interpersonal skills to reduce anxiety and improve interpersonal relationships Learn to accept limitations in life and commit to tolerating, rather than avoiding, unpleasant emotions while accomplishing meaningful goals Identify major life conflicts from the past and present that form the basis for present anxiety Maintain involvement in work, family, and social activities Reestablish a consistent sleep-wake cycle Cooperate with a medical evaluation  Interventions Engage the patient in behavioral activation Use instruction, modeling, and role-playing to build the client's general social, communication, and/or conflict resolution skills Use Acceptance and Commitment Therapy to help client accept uncomfortable realities in order to accomplish value-consistent goals Reinforce the client's insight into the role of his/her past emotional pain and present anxiety  Support the client in following through with work, family, and social activities Teach and implement sleep hygiene practices  Refer the patient to a physician for a psychotropic medication consultation Monito the clint's psychotropic medication compliance Discuss how anxiety typically involves excessive  worry, various bodily expressions of tension, and avoidance of what is threatening that interact to maintain the problem  Teach the patient relaxation skills Assign the patient homework Discuss examples demonstrating that unrealistic worry overestimates the probability of threats and underestimates patient's ability  Assist the patient in analyzing his or her worries Help patient understand that avoidance is reinforcing   Goals Reduce overall frequency, intensity, and duration of trauma related symptoms Stabilize  trauma while increasing ability to function Enhance ability to effectively cope with triggers, reminders, flashbacks, moods, and cognitions of trauma Learn and implement coping skills that result in a reduction of trauma related symptomsl  Objectives Verbalize an understanding of the cognitive, physiological, and behavioral components of post traumatic stress disorder Describe in detail reminders of the triggers related to trauma  Learn and implement strategies to manage thoughts, behaviors, and feelings of trauma Learning and implement calming skills to reduce overall trauma Verbalize an understanding of the role that cognitive biases play in excessive irrational worry and persistent trauma symptoms Learn and implement personal skills to manage challenging situations Learn and implement problem solving strategies Identify and engage in pleasant activities Learn to accept limitations in life and commit to tolerating, rather than avoiding, unpleasant emotions while accomplishing meaningful goals Maintain involvement in work, family, and social activities Reestablish a consistent sleep-wake cycle Cooperate with a medical evaluation  Interventions Engage the patient in behavioral activation Use instruction, modeling, and role-playing to build the client's general social, communication, and/or conflict resolution skills Use Acceptance and Commitment Therapy to help client accept  uncomfortable realities in order to accomplish value-consistent goals Support the client in following through with work, family, and social activities Teach and implement sleep hygiene practices  Refer the patient to a physician for a psychotropic medication consultation Monito the clint's psychotropic medication compliance Discuss how trauma typically involves excessive worry, various bodily expressions of tension, and avoidance of what is threatening that interact to  maintain the problem  Teach the patient relaxation skills Assign the patient homework Discuss examples demonstrating that unrealistic worry overestimates the probability of threats and underestimates patient's ability  Assist the patient in analyzing his or her worries Help patient understand that avoidance is reinforcing

## 2022-08-09 NOTE — Progress Notes (Signed)
                Kala Gassmann, PsyD 

## 2022-08-10 ENCOUNTER — Ambulatory Visit: Payer: 59 | Admitting: Neurology

## 2022-08-10 ENCOUNTER — Encounter: Payer: Self-pay | Admitting: Neurology

## 2022-08-10 DIAGNOSIS — Z029 Encounter for administrative examinations, unspecified: Secondary | ICD-10-CM

## 2022-09-13 ENCOUNTER — Ambulatory Visit: Payer: 59 | Admitting: Psychologist

## 2022-10-02 ENCOUNTER — Ambulatory Visit (INDEPENDENT_AMBULATORY_CARE_PROVIDER_SITE_OTHER): Payer: 59 | Admitting: Psychologist

## 2022-10-02 DIAGNOSIS — F4310 Post-traumatic stress disorder, unspecified: Secondary | ICD-10-CM | POA: Diagnosis not present

## 2022-10-02 DIAGNOSIS — F411 Generalized anxiety disorder: Secondary | ICD-10-CM

## 2022-10-02 NOTE — Progress Notes (Signed)
[facility] Counselor/Therapist Progress Note  Patient ID: Sharon Powell, MRN: [id number],    Date: 10/02/2022  Time Spent: 10:15 am to 10:46 am; total time: 31 minutes   This session was held via in person. The patient consented to in-person therapy and was in the clinician's office. Limits of confidentiality were discussed with the patient.    Treatment Type: Individual Therapy  Reported Symptoms: Stress  Mental Status Exam: Appearance:  Well Groomed     Behavior: Appropriate  Motor: Normal  Speech/Language:  Clear and Coherent  Affect: Appropriate  Mood: constricted  Thought process: normal  Thought content:   WNL  Sensory/Perceptual disturbances:   WNL  Orientation: oriented to person, place, and time/date  Attention: Good  Concentration: Good  Memory: WNL  Fund of knowledge:  Fair  Insight:   Poor  Judgment:  Poor  Impulse Control: Good   Risk Assessment: Danger to Self:  No Self-injurious Behavior: No Danger to Others: No Duty to Warn:no Physical Aggression / Violence:No  Access to Firearms a concern: No  Gang Involvement:No   Subjective: Patient described herself as doing poorly. After reviewing the treatment plan, patient reflected on experiencing multiple stressors. She also asked for the clinician to provide a doctor's  slip. She stated "I want a piece of paper so when I don't want to work, I don't have to and I won't be punished". Patient stated that she wanted coping skills. After practicing mindfulness, she indicated that she did not notice a change. Patient voiced feeling overwhelmed. Despite having potential solutions, patient stated "I don't want to do them". Patient processed thoughts and emotions. She denied suicidal and homicidal ideation.    Interventions:  Worked on developing a therapeutic relationship with the patient using active listening and reflective statements. Provided emotional support using empathy and validation. Reviewed the  treatment plan. Reviewed events since the intake. Validated and processed expressed emotions. Identified goals for the session. Explored the letter that patient wanted clinician to write on her behalf. Provided psychoeducation about documentation. Provided psychoeducation about mindfulness. Practiced mindfulness with the patient. Normalized patient's response. Used MI to roll with the resistance. Explored level of support that patient had available to her. Attempted to assist in problem solving. Validated emotions and thoughts expressed. Explored options for shelters. Offered different options for counseling. Assessed for suicidal and homicidal ideation.  Homework: Consider reaching out for help  Next Session: NA. This clinician will refer out for more intensive services  Diagnosis: F43.10 posttraumatic stress disorder and F41.1 generalized anxiety disorder  Plan:   Goals Reduce overall frequency, intensity, and duration of trauma related symptoms Stabilize  trauma while increasing ability to function Enhance ability to effectively cope with triggers, reminders, flashbacks, moods, and cognitions of trauma Learn and implement coping skills that result in a reduction of trauma related symptomsl  Objectives target date for all objectives is 08/10/2023 Verbalize an understanding of the cognitive, physiological, and behavioral components of post traumatic stress disorder Describe in detail reminders of the triggers related to trauma  Learn and implement strategies to manage thoughts, behaviors, and feelings of trauma Learning and implement calming skills to reduce overall trauma Verbalize an understanding of the role that cognitive biases play in excessive irrational worry and persistent trauma symptoms Learn and implement personal skills to manage challenging situations Learn and implement problem solving strategies Identify and engage in pleasant activities Learn to accept limitations in life  and commit to tolerating, rather than avoiding, unpleasant emotions while accomplishing meaningful  goals Maintain involvement in work, family, and social activities Reestablish a consistent sleep-wake cycle Cooperate with a medical evaluation  Interventions Engage the patient in behavioral activation Use instruction, modeling, and role-playing to build the client's general social, communication, and/or conflict resolution skills Use Acceptance and Commitment Therapy to help client accept uncomfortable realities in order to accomplish value-consistent goals Support the client in following through with work, family, and social activities Teach and implement sleep hygiene practices  Refer the patient to a physician for a psychotropic medication consultation Monito the clint's psychotropic medication compliance Discuss how trauma typically involves excessive worry, various bodily expressions of tension, and avoidance of what is threatening that interact to maintain the problem  Teach the patient relaxation skills Assign the patient homework Discuss examples demonstrating that unrealistic worry overestimates the probability of threats and underestimates patient's ability  Assist the patient in analyzing his or her worries Help patient understand that avoidance is reinforcing   Goals Reduce overall frequency, intensity, and duration of anxiety Stabilize anxiety level wile increasing ability to function Enhance ability to effectively cope with full variety of stressors Learn and implement coping skills that result in a reduction of anxiety    Objectives target date for all objectives is 08/10/2023 Verbalize an understanding of the cognitive, physiological, and behavioral components of anxiety Learning and implement calming skills to reduce overall anxiety Verbalize an understanding of the role that cognitive biases play in excessive irrational worry and persistent anxiety symptoms Identify,  challenge, and replace based fearful talk Learn and implement problem solving strategies Identify and engage in pleasant activities Learning and implement personal and interpersonal skills to reduce anxiety and improve interpersonal relationships Learn to accept limitations in life and commit to tolerating, rather than avoiding, unpleasant emotions while accomplishing meaningful goals Identify major life conflicts from the past and present that form the basis for present anxiety Maintain involvement in work, family, and social activities Reestablish a consistent sleep-wake cycle Cooperate with a medical evaluation  Interventions Engage the patient in behavioral activation Use instruction, modeling, and role-playing to build the client's general social, communication, and/or conflict resolution skills Use Acceptance and Commitment Therapy to help client accept uncomfortable realities in order to accomplish value-consistent goals Reinforce the client's insight into the role of his/her past emotional pain and present anxiety  Support the client in following through with work, family, and social activities Teach and implement sleep hygiene practices  Refer the patient to a physician for a psychotropic medication consultation Monito the clint's psychotropic medication compliance Discuss how anxiety typically involves excessive worry, various bodily expressions of tension, and avoidance of what is threatening that interact to maintain the problem  Teach the patient relaxation skills Assign the patient homework Discuss examples demonstrating that unrealistic worry overestimates the probability of threats and underestimates patient's ability  Assist the patient in analyzing his or her worries Help patient understand that avoidance is reinforcing    The patient and clinician reviewed the treatment plan on 10/02/2022. The patient approved of the treatment plan.   Conception Chancy, PsyD

## 2022-10-02 NOTE — Progress Notes (Signed)
                Gaylord Seydel, PsyD 

## 2022-11-08 ENCOUNTER — Telehealth: Payer: Self-pay | Admitting: Orthopaedic Surgery

## 2022-11-08 NOTE — Telephone Encounter (Signed)
Dr Minette Brine [phone]  has question about  fmla paper work  for patient

## 2022-11-12 ENCOUNTER — Telehealth: Payer: Self-pay

## 2022-11-12 NOTE — Telephone Encounter (Signed)
Dr.Dennis Gerilyn Nestle from [employer] called for [employer]. He states that he called last week as well and has not heard back yet. Please call him regarding FMLA certification. Call back [phone] Thanks!

## 2022-11-23 ENCOUNTER — Telehealth: Payer: Self-pay | Admitting: Orthopaedic Surgery

## 2022-11-23 NOTE — Telephone Encounter (Signed)
Patient would like to talk to [place] and would not say why [phone]

## 2022-11-23 NOTE — Telephone Encounter (Signed)
RC to patient about work disability paperwork. Pt scheduled to see Dr. Sammuel Hines 11/30/22

## 2022-11-23 NOTE — Telephone Encounter (Signed)
LMOM to return call.

## 2022-11-30 ENCOUNTER — Ambulatory Visit (INDEPENDENT_AMBULATORY_CARE_PROVIDER_SITE_OTHER): Payer: 59 | Admitting: Orthopaedic Surgery

## 2022-11-30 DIAGNOSIS — S83001A Unspecified subluxation of right patella, initial encounter: Secondary | ICD-10-CM

## 2022-11-30 NOTE — Progress Notes (Signed)
Post Operative Evaluation    Procedure/Date of Surgery: 08/31/21 right knee MPFL reconstruction and TTO with subsequent lysis of adhesions and manipulation under anesthesia done on 12/14/21  Interval History:   11/30/2022: Presents today for follow-up.  Overall the knee appears to be strong with good range of motion.  She is here today for further discussion of her job duties.  PMH/PSH/Family History/Social History/Meds/Allergies:    Past Medical History:  Diagnosis Date   Anemia    Anxiety    Closed head injury with concussion    and loss of consciousness   Migraines    Scoliosis    Past Surgical History:  Procedure Laterality Date   CYST REMOVAL LEG     KNEE ARTHROSCOPY Right 08/31/2021   Procedure: ARTHROSCOPY KNEE;  Surgeon: Vanetta Mulders, MD;  Location: [place];  Service: Orthopedics;  Laterality: Right;   KNEE ARTHROSCOPY Right 12/14/2021   Procedure: Right ARTHROSCOPY KNEE Lysis of adhesions with munipulation under anesthesia;  Surgeon: Vanetta Mulders, MD;  Location: [place];  Service: Orthopedics;  Laterality: Right;   MEDIAL PATELLOFEMORAL LIGAMENT REPAIR Right 08/31/2021   Procedure: RIGHT MEDIAL PATELLAFEMORAL RECONSTRUCTION WITH TIBIAL TUBERCLE OSTEOTOMY, RIGHT KNEE ARTHROSCOPY LATERAL MENISCAL DEBRIDEMENT;  Surgeon: Vanetta Mulders, MD;  Location: [place];  Service: Orthopedics;  Laterality: Right;   Social History   Socioeconomic History   Marital status: Single    Spouse name: Not on file   Number of children: Not on file   Years of education: Not on file   Highest education level: Not on file  Occupational History   Occupation: [employer]  Tobacco Use   Smoking status: Never   Smokeless tobacco: Never  Vaping Use   Vaping Use: Never used  Substance and Sexual Activity   Alcohol use: Not Currently   Drug use: No   Sexual activity: Yes    Birth control/protection: Pill, Implant  Other Topics Concern   Not on file  Social History  Narrative   Not on file   Social Determinants of Health   Financial Resource Strain: Not on file  Food Insecurity: Not on file  Transportation Needs: Not on file  Physical Activity: Not on file  Stress: Not on file  Social Connections: Not on file   Family History  Problem Relation Age of Onset   Migraines Mother    Diabetes Mother    Migraines Maternal Aunt    No Known Allergies Current Outpatient Medications  Medication Sig Dispense Refill   acetaminophen (TYLENOL) 500 MG tablet Take 500 mg by mouth every 6 (six) hours as needed for headache.     fluconazole (DIFLUCAN) 150 MG tablet Take 1 tablet (150 mg total) by mouth as directed. Repeat in 4 days if needed 2 tablet 0   naproxen (NAPROSYN) 500 MG tablet Take 1 tablet (500 mg total) by mouth every 12 (twelve) hours as needed for moderate pain or mild pain. 30 tablet 0   nortriptyline (PAMELOR) 25 MG capsule Take 1 capsule (25 mg total) by mouth at bedtime. 30 capsule 5   nystatin-triamcinolone ointment (MYCOLOG) Apply 1 Application topically 2 (two) times daily. 30 g 0   ondansetron (ZOFRAN) 4 MG tablet Take 1 tablet (4 mg total) by mouth every 8 (eight) hours as needed for nausea or vomiting. 20 tablet 5  phenol (CHLORASEPTIC) 1.4 % LIQD Use as directed 1 spray in the mouth or throat as needed for throat irritation / pain. 20 mL 0   scopolamine (TRANSDERM-SCOP) 1 MG/3DAYS Place 1 patch (1.5 mg total) onto the skin every 3 (three) days. (Patient not taking: Reported on 12/03/2021) 10 patch 12   SUMAtriptan (IMITREX) 100 MG tablet Take 1 tablet (100 mg total) by mouth as needed for up to 1 dose for migraine (may repeat after 2 hours.  Maximum 2 tablets in 24 ours.). May repeat in 2 hours if headache persists or recurs. 10 tablet 5   No current facility-administered medications for this visit.   No results found.  Review of Systems:   A ROS was performed including pertinent positives and negatives as documented in the  HPI.   Musculoskeletal Exam:    There were no vitals taken for this visit.  Right knee incisions are well-healing.  Patella is well centralized.  Range of motion is from 0 degrees to 135.  Negative patella apprehension.  Imaging:   X-ray right knee 3 views Status post tibial tubercle osteotomy without evidence of complication  I personally reviewed and interpreted the radiographs.   Assessment:   27 year old female status post right knee MPFL reconstruction with tibial tubercle osteotomy she subsequently underwent a right knee arthroscopic lysis of adhesion.  At today's visit she is doing well from a knee perspective.  I do believe that she may benefit from a return to work at this time.  I will plan to see her back as needed Plan :    -Return to clinic as needed      I personally saw and evaluated the patient, and participated in the management and treatment plan.  Vanetta Mulders, MD Attending Physician, Orthopedic Surgery  This document was dictated using Dragon voice recognition software. A reasonable attempt at proof reading has been made to minimize errors.

## 2022-12-24 ENCOUNTER — Telehealth: Payer: 59 | Admitting: Nurse Practitioner

## 2022-12-24 ENCOUNTER — Encounter: Payer: Self-pay | Admitting: Nurse Practitioner

## 2022-12-24 DIAGNOSIS — R4589 Other symptoms and signs involving emotional state: Secondary | ICD-10-CM | POA: Diagnosis not present

## 2022-12-24 NOTE — Progress Notes (Signed)
Virtual Visit Consent   Sharon Powell, you are scheduled for a virtual visit with a [facility] provider today. Just as with appointments in the office, your consent must be obtained to participate. Your consent will be active for this visit and any virtual visit you may have with one of our providers in the next 365 days. If you have a MyChart account, a copy of this consent can be sent to you electronically.  As this is a virtual visit, video technology does not allow for your provider to perform a traditional examination. This may limit your provider's ability to fully assess your condition. If your provider identifies any concerns that need to be evaluated in person or the need to arrange testing (such as labs, EKG, etc.), we will make arrangements to do so. Although advances in technology are sophisticated, we cannot ensure that it will always work on either your end or our end. If the connection with a video visit is poor, the visit may have to be switched to a telephone visit. With either a video or telephone visit, we are not always able to ensure that we have a secure connection.  By engaging in this virtual visit, you consent to the provision of healthcare and authorize for your insurance to be billed (if applicable) for the services provided during this visit. Depending on your insurance coverage, you may receive a charge related to this service.  I need to obtain your verbal consent now. Are you willing to proceed with your visit today? Sharon Powell has provided verbal consent on 12/24/2022 for a virtual visit (video or telephone). Sharon Schneiders, FNP  Date: 12/24/2022 7:45 PM  Virtual Visit via Video Note   I, Sharon Powell, connected with  Sharon Powell  ([id number], [birth year]) on 12/24/22 at  7:45 PM EST by a video-enabled telemedicine application and verified that I am speaking with the correct person using two identifiers.  Location: Patient: Virtual Visit Location Patient:  Home Provider: Virtual Visit Location Provider: Home Office   I discussed the limitations of evaluation and management by telemedicine and the availability of in person appointments. The patient expressed understanding and agreed to proceed.    History of Present Illness: Sharon Powell is a 27 y.o. who identifies as a female who was assigned female at birth, and is being seen today for follow up regarding anxiety, depression and PTSD.  Most recently she was seen by a counselor 10/02/22  She has been struggling financially and was recently approved for housing  She has an emotional support animal  Her current documentation is from over a year ago, she is looking for help with the paperwork for having her emotional support animal live with her at the new housing facility she was approved for   She is currently being managed by her Neurologist (Dr. Tomi Likens) and her therapist (counselor)   She has attempted to reach her Neurologist regarding this concern.  She is anxious that she will lose her spot in the housing facility without current documentation of the need for her emotional support animal.   She is otherwise well managed, denies SI Follow up with counseling regularly   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   Patellar subluxation, right, initial encounter    Tear of lateral meniscus of right knee, current    Vitamin D deficiency 04/17/2021   Anemia 04/14/2021  Migraine 04/14/2021    Allergies: No Known Allergies Medications:  Current Outpatient Medications:    acetaminophen (TYLENOL) 500 MG tablet, Take 500 mg by mouth every 6 (six) hours as needed for headache., Disp: , Rfl:    fluconazole (DIFLUCAN) 150 MG tablet, Take 1 tablet (150 mg total) by mouth as directed. Repeat in 4 days if needed, Disp: 2 tablet, Rfl: 0   naproxen (NAPROSYN) 500 MG tablet, Take 1 tablet  (500 mg total) by mouth every 12 (twelve) hours as needed for moderate pain or mild pain., Disp: 30 tablet, Rfl: 0   nortriptyline (PAMELOR) 25 MG capsule, Take 1 capsule (25 mg total) by mouth at bedtime., Disp: 30 capsule, Rfl: 5   nystatin-triamcinolone ointment (MYCOLOG), Apply 1 Application topically 2 (two) times daily., Disp: 30 g, Rfl: 0   ondansetron (ZOFRAN) 4 MG tablet, Take 1 tablet (4 mg total) by mouth every 8 (eight) hours as needed for nausea or vomiting., Disp: 20 tablet, Rfl: 5   phenol (CHLORASEPTIC) 1.4 % LIQD, Use as directed 1 spray in the mouth or throat as needed for throat irritation / pain., Disp: 20 mL, Rfl: 0   scopolamine (TRANSDERM-SCOP) 1 MG/3DAYS, Place 1 patch (1.5 mg total) onto the skin every 3 (three) days. (Patient not taking: Reported on 12/03/2021), Disp: 10 patch, Rfl: 12   SUMAtriptan (IMITREX) 100 MG tablet, Take 1 tablet (100 mg total) by mouth as needed for up to 1 dose for migraine (may repeat after 2 hours.  Maximum 2 tablets in 24 ours.). May repeat in 2 hours if headache persists or recurs., Disp: 10 tablet, Rfl: 5  Observations/Objective: Patient is well-developed, well-nourished in no acute distress.  Resting comfortably  at home.  Head is normocephalic, atraumatic.  No labored breathing.  Speech is clear and coherent with logical content.  Patient is alert and oriented at baseline.  She is in not acute emotional distress at this time   Assessment and Plan: 1. Need for emotional support Discussed with patient we cannot complete any paperwork through this service at this time.  Advised she follow up with neurology and copied her provider on the note today.   She will continue to followup as discussed  Also sent information through MyChart regarding behavioral health UC services   Follow Up Instructions: I discussed the assessment and treatment plan with the patient. The patient was provided an opportunity to ask questions and all were answered.  The patient agreed with the plan and demonstrated an understanding of the instructions.  A copy of instructions were sent to the patient via MyChart unless otherwise noted below.    The patient was advised to call back or seek an in-person evaluation if the symptoms worsen or if the condition fails to improve as anticipated.  Time:  I spent 10 minutes with the patient via telehealth technology discussing the above problems/concerns.    Sharon Schneiders, FNP

## 2022-12-26 ENCOUNTER — Telehealth: Payer: Self-pay | Admitting: Neurology

## 2022-12-26 NOTE — Telephone Encounter (Signed)
Patient advised she is seeing Dr.jaffe for Migraines not Anxiety. Per Last note: Refer to behavioral medicine to see a counselor to discuss her anxiety

## 2022-12-26 NOTE — Telephone Encounter (Signed)
Patient needs to have her ESA updated it is out of date. She would like to know if this is something that can be done by the end of the day. She will have to pay a fine if she does not get this updated  please call

## 2022-12-27 NOTE — Telephone Encounter (Signed)
Per Dr.jaffe, I am not treating her anxiety.  She was referred to [facility] for treatment of her anxiety.  Therefore, any request for an emotional support animal should be addressed to them.

## 2023-10-14 IMAGING — DX DG KNEE COMPLETE 4+V*R*
4 series · 4 of 4 positions shown · non-contrast
Comparison: 09/14/2021

CLINICAL DATA: Limited range of motion, initial encounter

EXAM:
RIGHT KNEE - COMPLETE 4+ VIEW

[knee ap]
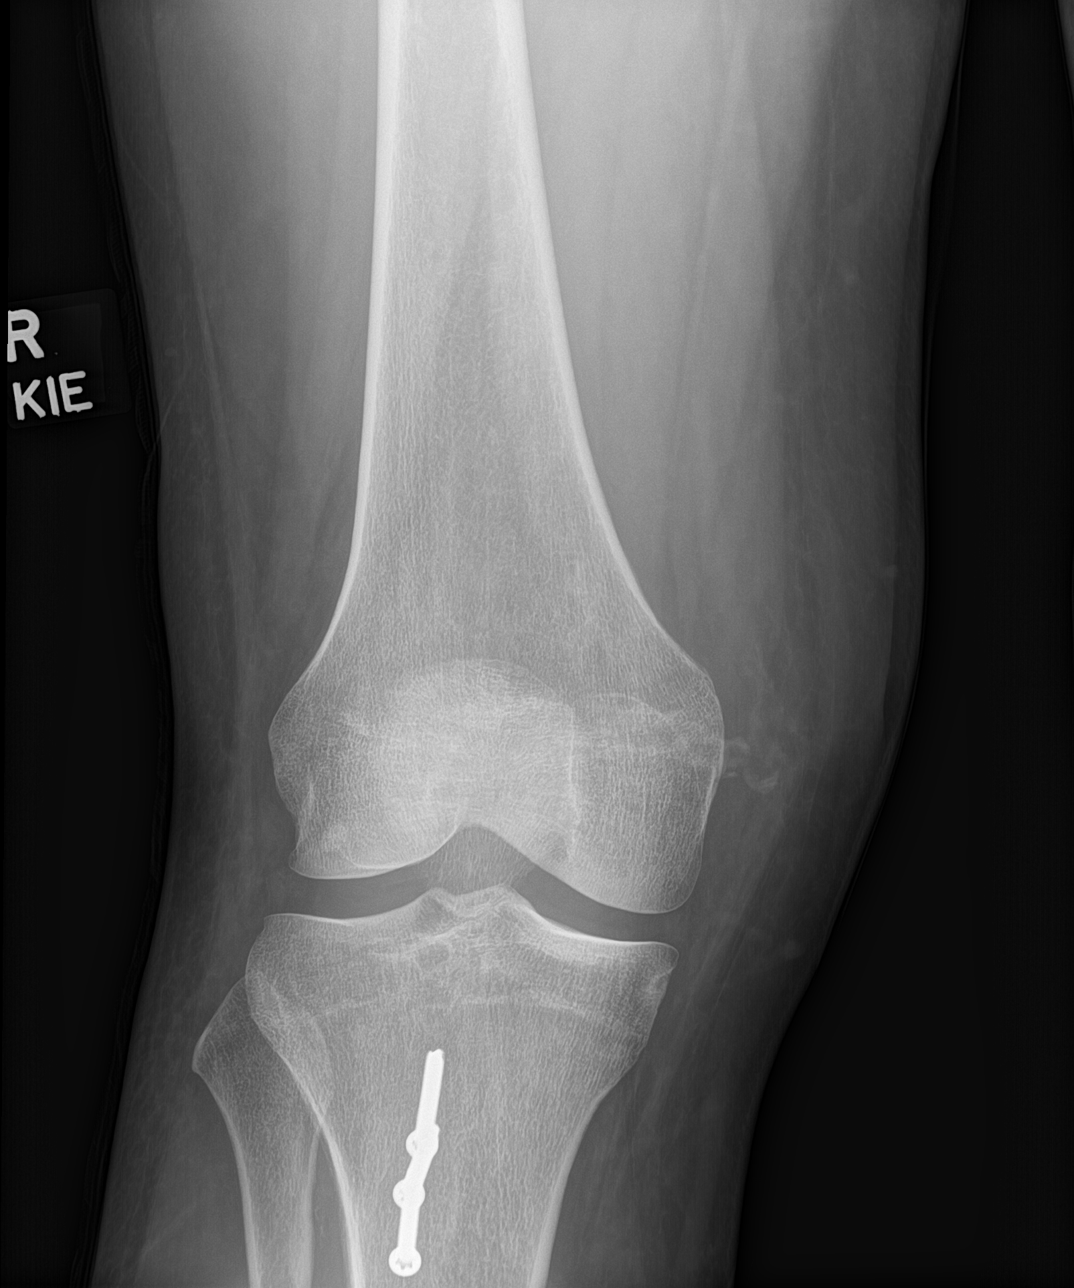

[knee lat]
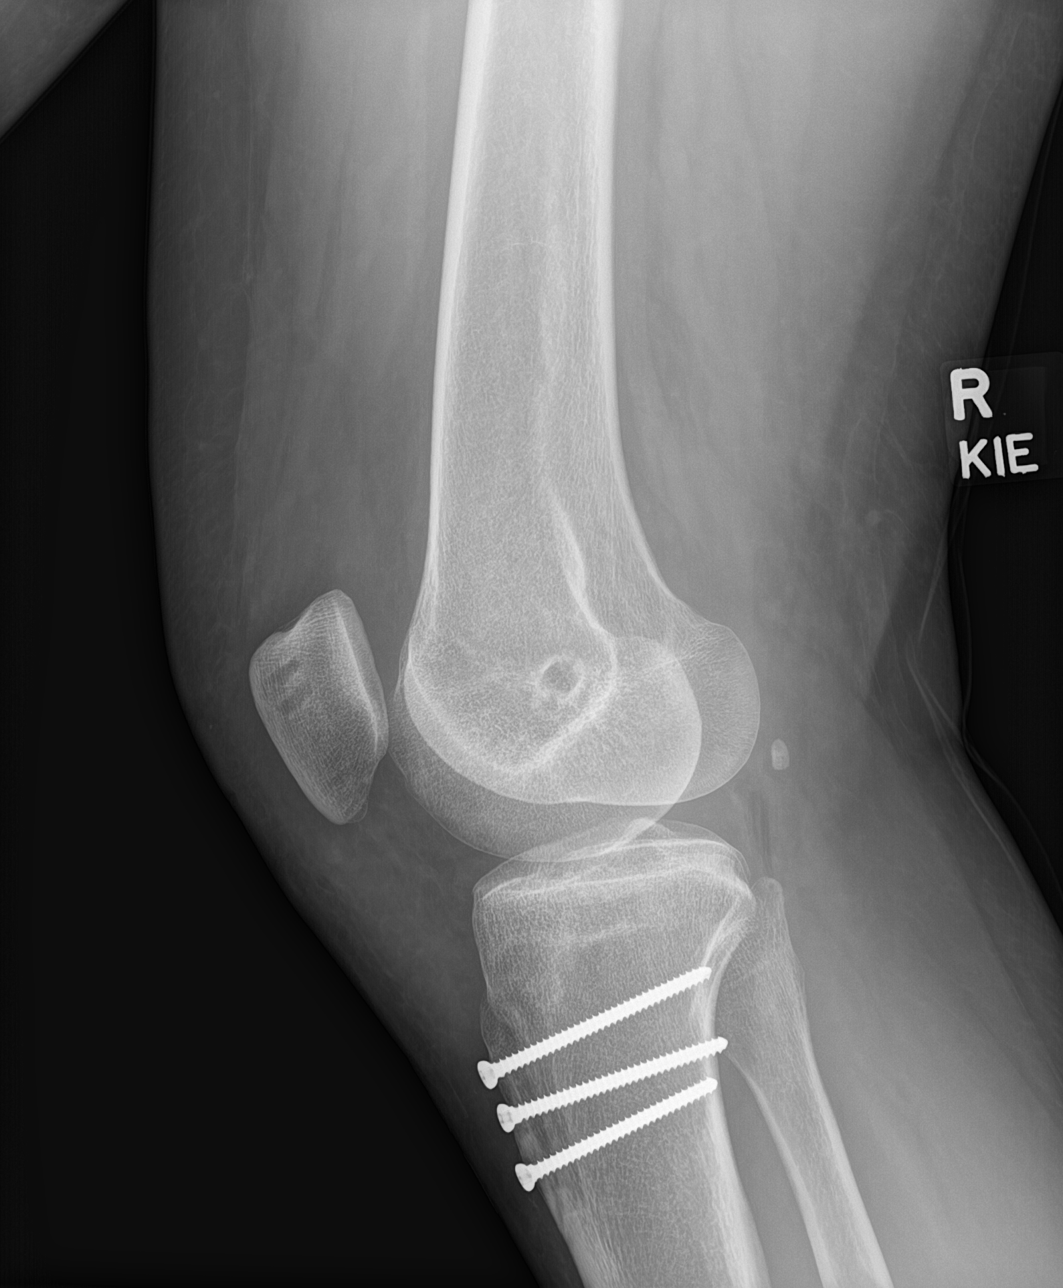

[patella skyline]
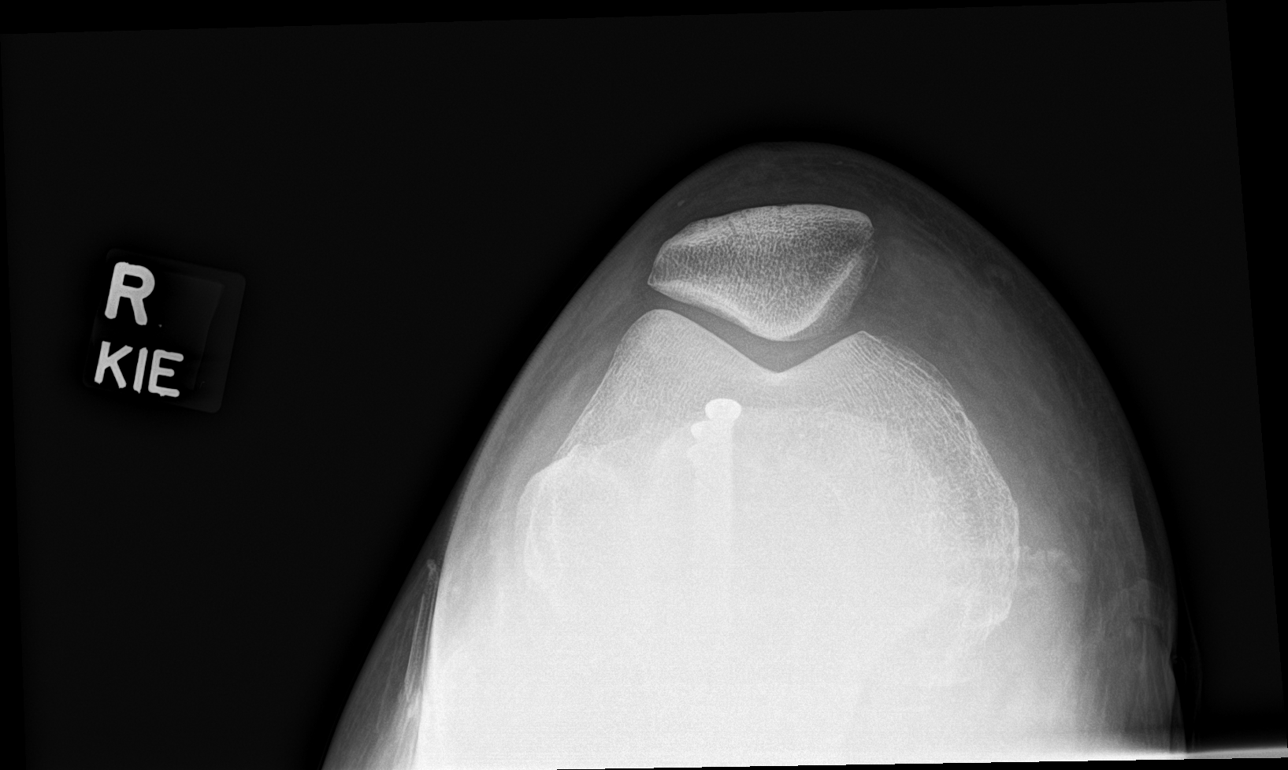

[knee tunnel]
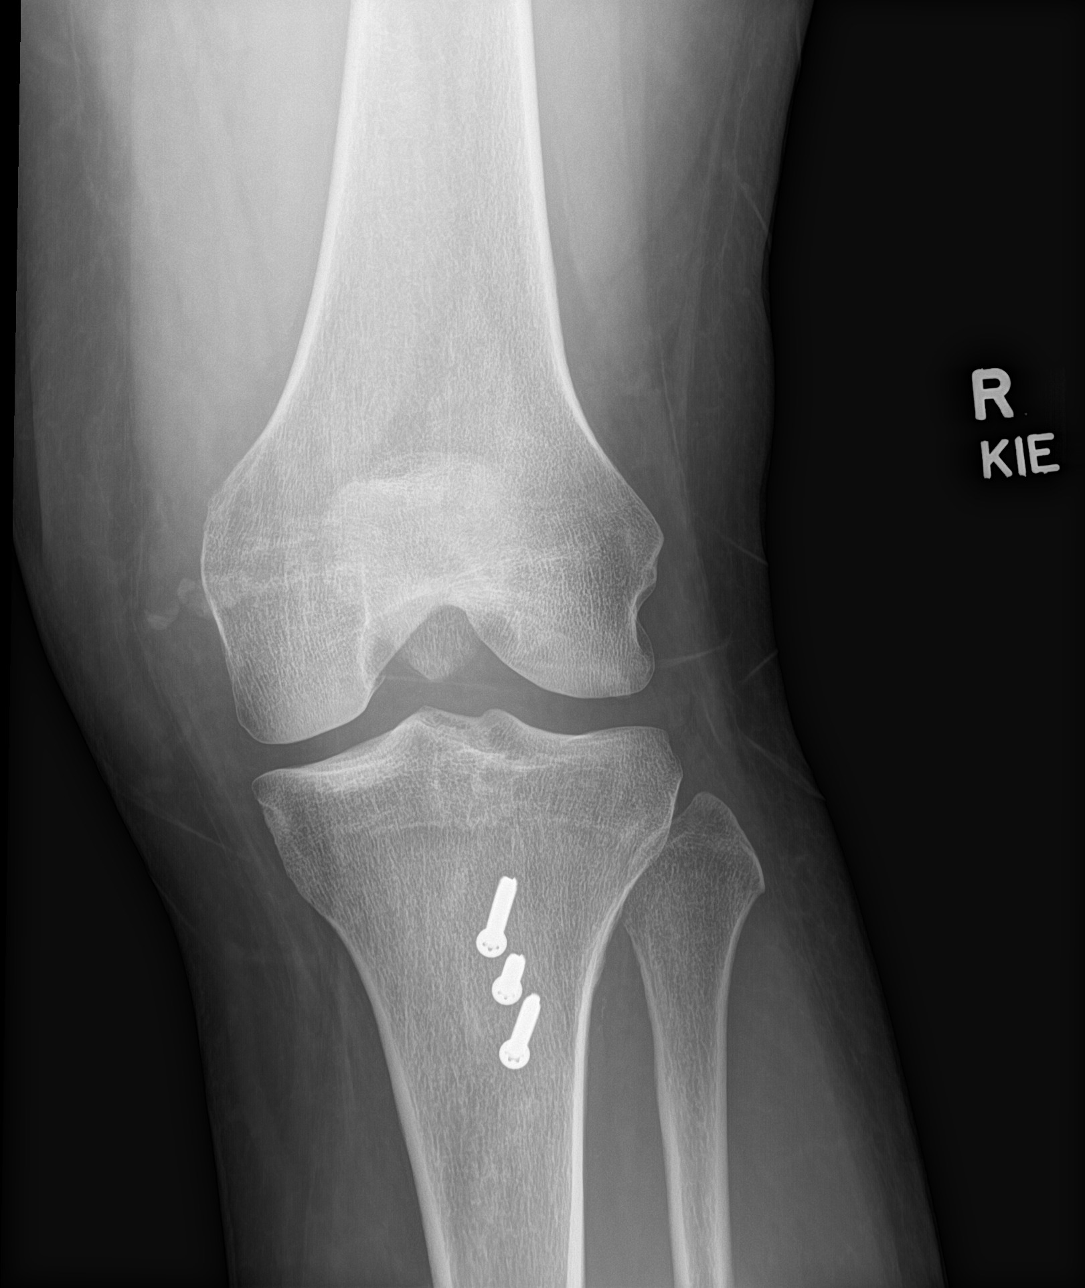

[4 of 4 positions shown; findings below may reference images not displayed]

FINDINGS: Postsurgical changes are again identified. Three fixation screws are
seen in the proximal tibia. No joint effusion is noted. No acute
fracture or dislocation is noted.
IMPRESSION: Postoperative change without acute abnormality.
